# Patient Record
Sex: Male | Born: 1960 | Race: Black or African American | Hispanic: No | Marital: Single | Smoking: Current every day smoker
Health system: Southern US, Community
[De-identification: ages and names within clinical notes are randomized; demographics above are authoritative.]

## PROBLEM LIST (undated history)

## (undated) DIAGNOSIS — E119 Type 2 diabetes mellitus without complications: Secondary | ICD-10-CM

## (undated) DIAGNOSIS — I10 Essential (primary) hypertension: Secondary | ICD-10-CM

## (undated) DIAGNOSIS — J40 Bronchitis, not specified as acute or chronic: Secondary | ICD-10-CM

## (undated) DIAGNOSIS — K219 Gastro-esophageal reflux disease without esophagitis: Secondary | ICD-10-CM

## (undated) DIAGNOSIS — E782 Mixed hyperlipidemia: Secondary | ICD-10-CM

## (undated) DIAGNOSIS — I252 Old myocardial infarction: Secondary | ICD-10-CM

## (undated) HISTORY — PX: HERNIA REPAIR: SHX51

## (undated) HISTORY — DX: Type 2 diabetes mellitus without complications: E11.9

## (undated) HISTORY — DX: Essential (primary) hypertension: I10

## (undated) HISTORY — PX: OTHER SURGICAL HISTORY: SHX169

## (undated) HISTORY — DX: Old myocardial infarction: I25.2

## (undated) HISTORY — DX: Gastro-esophageal reflux disease without esophagitis: K21.9

## (undated) HISTORY — DX: Mixed hyperlipidemia: E78.2

---

## 2014-09-16 DIAGNOSIS — I252 Old myocardial infarction: Secondary | ICD-10-CM

## 2014-09-16 HISTORY — DX: Old myocardial infarction: I25.2

## 2017-08-29 ENCOUNTER — Observation Stay (HOSPITAL_COMMUNITY)
Admission: EM | Admit: 2017-08-29 | Discharge: 2017-08-30 | Disposition: A | Discharge status: Home / Self Care | Payer: Medicaid Other | Attending: Family Medicine | Admitting: Family Medicine

## 2017-08-29 ENCOUNTER — Emergency Department (HOSPITAL_COMMUNITY): Payer: Medicaid Other

## 2017-08-29 ENCOUNTER — Encounter (HOSPITAL_COMMUNITY): Payer: Self-pay | Admitting: Emergency Medicine

## 2017-08-29 ENCOUNTER — Other Ambulatory Visit: Payer: Self-pay

## 2017-08-29 DIAGNOSIS — N179 Acute kidney failure, unspecified: Secondary | ICD-10-CM | POA: Insufficient documentation

## 2017-08-29 DIAGNOSIS — J189 Pneumonia, unspecified organism: Secondary | ICD-10-CM | POA: Diagnosis present

## 2017-08-29 DIAGNOSIS — I10 Essential (primary) hypertension: Secondary | ICD-10-CM

## 2017-08-29 DIAGNOSIS — R0602 Shortness of breath: Secondary | ICD-10-CM | POA: Insufficient documentation

## 2017-08-29 DIAGNOSIS — Z23 Encounter for immunization: Secondary | ICD-10-CM | POA: Insufficient documentation

## 2017-08-29 DIAGNOSIS — E1165 Type 2 diabetes mellitus with hyperglycemia: Secondary | ICD-10-CM | POA: Diagnosis present

## 2017-08-29 DIAGNOSIS — IMO0002 Reserved for concepts with insufficient information to code with codable children: Secondary | ICD-10-CM | POA: Diagnosis present

## 2017-08-29 DIAGNOSIS — J181 Lobar pneumonia, unspecified organism: Secondary | ICD-10-CM | POA: Diagnosis not present

## 2017-08-29 DIAGNOSIS — R739 Hyperglycemia, unspecified: Secondary | ICD-10-CM

## 2017-08-29 DIAGNOSIS — E785 Hyperlipidemia, unspecified: Secondary | ICD-10-CM | POA: Diagnosis not present

## 2017-08-29 DIAGNOSIS — Z720 Tobacco use: Secondary | ICD-10-CM

## 2017-08-29 DIAGNOSIS — R0789 Other chest pain: Secondary | ICD-10-CM

## 2017-08-29 DIAGNOSIS — F17210 Nicotine dependence, cigarettes, uncomplicated: Secondary | ICD-10-CM | POA: Diagnosis not present

## 2017-08-29 DIAGNOSIS — R079 Chest pain, unspecified: Secondary | ICD-10-CM | POA: Diagnosis present

## 2017-08-29 HISTORY — DX: Bronchitis, not specified as acute or chronic: J40

## 2017-08-29 LAB — CBC
HCT: 45.2 % (ref 39.0–52.0)
HCT: 42.7 % (ref 39.0–52.0)
HEMOGLOBIN: 13.9 g/dL (ref 13.0–17.0)
Hemoglobin: 14.9 g/dL (ref 13.0–17.0)
MCH: 27.7 pg (ref 26.0–34.0)
MCH: 27.3 pg (ref 26.0–34.0)
MCHC: 33 g/dL (ref 30.0–36.0)
MCHC: 32.6 g/dL (ref 30.0–36.0)
MCV: 84 fL (ref 78.0–100.0)
MCV: 83.9 fL (ref 78.0–100.0)
PLATELETS: 271 10*3/uL (ref 150–400)
Platelets: 285 K/uL (ref 150–400)
RBC: 5.38 MIL/uL (ref 4.22–5.81)
RBC: 5.09 MIL/uL (ref 4.22–5.81)
RDW: 15.9 % — ABNORMAL HIGH (ref 11.5–15.5)
RDW: 15.9 % — ABNORMAL HIGH (ref 11.5–15.5)
WBC: 10 K/uL (ref 4.0–10.5)
WBC: 9.1 10*3/uL (ref 4.0–10.5)

## 2017-08-29 LAB — HEPATIC FUNCTION PANEL
ALBUMIN: 4.6 g/dL (ref 3.5–5.0)
ALT: 19 U/L (ref 17–63)
AST: 18 U/L (ref 15–41)
Alkaline Phosphatase: 131 U/L — ABNORMAL HIGH (ref 38–126)
BILIRUBIN DIRECT: 0.1 mg/dL (ref 0.1–0.5)
BILIRUBIN TOTAL: 0.8 mg/dL (ref 0.3–1.2)
Indirect Bilirubin: 0.7 mg/dL (ref 0.3–0.9)
Total Protein: 8.5 g/dL — ABNORMAL HIGH (ref 6.5–8.1)

## 2017-08-29 LAB — CREATININE, SERUM
CREATININE: 1.19 mg/dL (ref 0.61–1.24)
GFR calc non Af Amer: >60 mL/min (ref >60)

## 2017-08-29 LAB — GLUCOSE, CAPILLARY: Glucose-Capillary: 465 mg/dL — ABNORMAL HIGH (ref 65–99)

## 2017-08-29 LAB — C-REACTIVE PROTEIN: CRP: 5.9 mg/dL — ABNORMAL HIGH (ref <1.0)

## 2017-08-29 LAB — PROTIME-INR
INR: 0.88
Prothrombin Time: 11.9 s (ref 11.4–15.2)

## 2017-08-29 LAB — SEDIMENTATION RATE: Sed Rate: 27 mm/hr — ABNORMAL HIGH (ref 0–16)

## 2017-08-29 LAB — BASIC METABOLIC PANEL
ANION GAP: 12 (ref 5–15)
BUN: 14 mg/dL (ref 6–20)
CO2: 25 mmol/L (ref 22–32)
Calcium: 9.8 mg/dL (ref 8.9–10.3)
Chloride: 95 mmol/L — ABNORMAL LOW (ref 101–111)
Creatinine, Ser: 1.25 mg/dL — ABNORMAL HIGH (ref 0.61–1.24)
GFR calc Af Amer: >60 mL/min (ref >60)
GLUCOSE: 494 mg/dL — AB (ref 65–99)
POTASSIUM: 4.2 mmol/L (ref 3.5–5.1)
SODIUM: 132 mmol/L — AB (ref 135–145)

## 2017-08-29 LAB — TROPONIN I: Troponin I: <0.03 ng/mL

## 2017-08-29 LAB — BRAIN NATRIURETIC PEPTIDE: B Natriuretic Peptide: 9 pg/mL (ref 0.0–100.0)

## 2017-08-29 MED ORDER — INFLUENZA VAC SPLIT QUAD 0.5 ML IM SUSY
0.5000 mL | PREFILLED_SYRINGE | INTRAMUSCULAR | Status: AC
  Administered 2017-08-30: 0.5 mL via INTRAMUSCULAR

## 2017-08-29 MED ORDER — LEVOFLOXACIN IN D5W 500 MG/100ML IV SOLN
500.0000 mg | Freq: Once | INTRAVENOUS | Status: AC
  Administered 2017-08-29: 500 mg via INTRAVENOUS
  Filled 2017-08-29: qty 100

## 2017-08-29 MED ORDER — IPRATROPIUM-ALBUTEROL 0.5-2.5 (3) MG/3ML IN SOLN
3.0000 mL | RESPIRATORY_TRACT | Status: DC | PRN
  Administered 2017-08-30: 3 mL via RESPIRATORY_TRACT
  Filled 2017-08-29: qty 3

## 2017-08-29 MED ORDER — CLOPIDOGREL BISULFATE 75 MG PO TABS
75.0000 mg | ORAL_TABLET | Freq: Every day | ORAL | Status: DC
  Administered 2017-08-30: 75 mg via ORAL
  Filled 2017-08-29: qty 1

## 2017-08-29 MED ORDER — PREDNISONE 10 MG PO TABS
15.0000 mg | ORAL_TABLET | Freq: Two times a day (BID) | ORAL | Status: DC
  Administered 2017-08-29 – 2017-08-30 (×2): 15 mg via ORAL
  Filled 2017-08-29 (×2): qty 2

## 2017-08-29 MED ORDER — CARVEDILOL 3.125 MG PO TABS
3.1250 mg | ORAL_TABLET | Freq: Two times a day (BID) | ORAL | Status: DC
  Administered 2017-08-29 – 2017-08-30 (×2): 3.125 mg via ORAL
  Filled 2017-08-29 (×2): qty 1

## 2017-08-29 MED ORDER — SODIUM CHLORIDE 0.9 % IV SOLN
Freq: Once | INTRAVENOUS | Status: AC
  Administered 2017-08-29: 18:00:00 via INTRAVENOUS

## 2017-08-29 MED ORDER — SODIUM CHLORIDE 0.9 % IV BOLUS (SEPSIS)
1000.0000 mL | Freq: Once | INTRAVENOUS | Status: AC
  Administered 2017-08-29: 1000 mL via INTRAVENOUS

## 2017-08-29 MED ORDER — ENOXAPARIN SODIUM 60 MG/0.6ML ~~LOC~~ SOLN
60.0000 mg | SUBCUTANEOUS | Status: DC
  Administered 2017-08-29: 60 mg via SUBCUTANEOUS
  Filled 2017-08-29: qty 0.6

## 2017-08-29 MED ORDER — LACTATED RINGERS IV SOLN: INTRAVENOUS | Status: DC

## 2017-08-29 MED ORDER — INSULIN ASPART 100 UNIT/ML ~~LOC~~ SOLN
0.0000 [IU] | Freq: Every day | SUBCUTANEOUS | Status: DC
  Administered 2017-08-29: 7 [IU] via SUBCUTANEOUS

## 2017-08-29 MED ORDER — HYDROCODONE-ACETAMINOPHEN 5-325 MG PO TABS
1.0000 | ORAL_TABLET | Freq: Once | ORAL | Status: AC
  Administered 2017-08-29: 1 via ORAL
  Filled 2017-08-29: qty 1

## 2017-08-29 MED ORDER — CEFTRIAXONE SODIUM 1 G IJ SOLR
1.0000 g | INTRAMUSCULAR | Status: DC
  Filled 2017-08-29 (×2): qty 10

## 2017-08-29 MED ORDER — AZITHROMYCIN 250 MG PO TABS: 500.0000 mg | ORAL_TABLET | ORAL | Status: DC

## 2017-08-29 MED ORDER — INSULIN DETEMIR 100 UNIT/ML ~~LOC~~ SOLN
10.0000 [IU] | Freq: Every day | SUBCUTANEOUS | Status: DC
  Administered 2017-08-29: 10 [IU] via SUBCUTANEOUS
  Filled 2017-08-29 (×3): qty 0.1

## 2017-08-29 MED ORDER — HYDRALAZINE HCL 20 MG/ML IJ SOLN: 10.0000 mg | INTRAMUSCULAR | Status: DC | PRN

## 2017-08-29 MED ORDER — ASPIRIN EC 81 MG PO TBEC
81.0000 mg | DELAYED_RELEASE_TABLET | Freq: Every day | ORAL | Status: DC
  Administered 2017-08-30: 81 mg via ORAL
  Filled 2017-08-29: qty 1

## 2017-08-29 MED ORDER — NICOTINE 7 MG/24HR TD PT24
7.0000 mg | MEDICATED_PATCH | Freq: Every day | TRANSDERMAL | Status: DC
  Administered 2017-08-29 – 2017-08-30 (×2): 7 mg via TRANSDERMAL
  Filled 2017-08-29 (×2): qty 1

## 2017-08-29 MED ORDER — INSULIN ASPART 100 UNIT/ML ~~LOC~~ SOLN
0.0000 [IU] | Freq: Three times a day (TID) | SUBCUTANEOUS | Status: DC
  Administered 2017-08-30: 20 [IU] via SUBCUTANEOUS

## 2017-08-29 MED ORDER — ATORVASTATIN CALCIUM 40 MG PO TABS
80.0000 mg | ORAL_TABLET | Freq: Every day | ORAL | Status: DC
  Administered 2017-08-29: 80 mg via ORAL
  Filled 2017-08-29 (×3): qty 1
  Filled 2017-08-29: qty 2
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
## 2017-08-29 NOTE — ED Notes (Signed)
This RN has contacted Lab regarding lab add ons

## 2017-08-29 NOTE — ED Triage Notes (Signed)
PT c/o chest pain with deep breathing with productive yellow sputum cough x2 days. PT states he has a cardiac hx with 4 stents as well.

## 2017-08-29 NOTE — ED Notes (Signed)
[facility] called Rocephin and LR are not compatible. Spoke with dr Manuella Ghazi and gave order to change LR to NS
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
## 2017-08-29 NOTE — ED Notes (Signed)
Pt requesting pain medication for pain that goes across chest and radiates to right side as well as HA

## 2017-08-29 NOTE — Plan of Care (Signed)
Pt alert and oriented x4. Able to make needs known. No complaint of pain or distress at this time. Telemetry in place box 36. 20G to right FA with saline running at 40ml/hr. Bed in lowest position, call light within reach. Will continue to monitor.

## 2017-08-30 ENCOUNTER — Encounter (HOSPITAL_COMMUNITY): Payer: Self-pay | Admitting: Family Medicine

## 2017-08-30 ENCOUNTER — Observation Stay (HOSPITAL_BASED_OUTPATIENT_CLINIC_OR_DEPARTMENT_OTHER): Payer: Medicaid Other

## 2017-08-30 DIAGNOSIS — Z720 Tobacco use: Secondary | ICD-10-CM

## 2017-08-30 DIAGNOSIS — E785 Hyperlipidemia, unspecified: Secondary | ICD-10-CM

## 2017-08-30 DIAGNOSIS — R0789 Other chest pain: Secondary | ICD-10-CM

## 2017-08-30 DIAGNOSIS — E1165 Type 2 diabetes mellitus with hyperglycemia: Secondary | ICD-10-CM

## 2017-08-30 DIAGNOSIS — J181 Lobar pneumonia, unspecified organism: Secondary | ICD-10-CM | POA: Diagnosis not present

## 2017-08-30 DIAGNOSIS — I10 Essential (primary) hypertension: Secondary | ICD-10-CM | POA: Diagnosis not present

## 2017-08-30 DIAGNOSIS — F17210 Nicotine dependence, cigarettes, uncomplicated: Secondary | ICD-10-CM | POA: Diagnosis not present

## 2017-08-30 DIAGNOSIS — IMO0002 Reserved for concepts with insufficient information to code with codable children: Secondary | ICD-10-CM | POA: Diagnosis present

## 2017-08-30 DIAGNOSIS — R739 Hyperglycemia, unspecified: Secondary | ICD-10-CM

## 2017-08-30 DIAGNOSIS — R079 Chest pain, unspecified: Secondary | ICD-10-CM

## 2017-08-30 LAB — HEMOGLOBIN A1C
Hgb A1c MFr Bld: 11.5 % — ABNORMAL HIGH (ref 4.8–5.6)
MEAN PLASMA GLUCOSE: 283 mg/dL

## 2017-08-30 LAB — TROPONIN I

## 2017-08-30 LAB — ECHOCARDIOGRAM COMPLETE
Height: 72 in
WEIGHTICAEL: 4528 [oz_av]

## 2017-08-30 LAB — GLUCOSE, CAPILLARY
GLUCOSE-CAPILLARY: 406 mg/dL — AB (ref 65–99)
GLUCOSE-CAPILLARY: 431 mg/dL — AB (ref 65–99)

## 2017-08-30 LAB — HIV ANTIBODY (ROUTINE TESTING W REFLEX): HIV SCREEN 4TH GENERATION: NONREACTIVE

## 2017-08-30 MED ORDER — BLOOD GLUCOSE METER KIT: PACK | 0 refills | Status: DC

## 2017-08-30 MED ORDER — LIVING WELL WITH DIABETES BOOK
Freq: Once | Status: AC
  Administered 2017-08-30: 09:00:00
  Filled 2017-08-30: qty 1

## 2017-08-30 MED ORDER — LEVOFLOXACIN 750 MG PO TABS: 750.0000 mg | ORAL_TABLET | Freq: Every day | ORAL | 0 refills | Status: AC

## 2017-08-30 MED ORDER — METFORMIN HCL ER 500 MG PO TB24
500.0000 mg | ORAL_TABLET | Freq: Two times a day (BID) | ORAL | Status: DC
  Administered 2017-08-30: 500 mg via ORAL

## 2017-08-30 MED ORDER — INSULIN ASPART 100 UNIT/ML ~~LOC~~ SOLN
35.0000 [IU] | Freq: Once | SUBCUTANEOUS | Status: AC
  Administered 2017-08-30: 35 [IU] via SUBCUTANEOUS

## 2017-08-30 MED ORDER — LEVOFLOXACIN 750 MG PO TABS: 750.0000 mg | ORAL_TABLET | Freq: Every day | ORAL | 0 refills | Status: DC

## 2017-08-30 MED ORDER — INSULIN GLARGINE 100 UNIT/ML SOLOSTAR PEN: 15.0000 [IU] | PEN_INJECTOR | Freq: Every day | SUBCUTANEOUS | 0 refills | Status: DC

## 2017-08-30 MED ORDER — INSULIN ASPART 100 UNIT/ML ~~LOC~~ SOLN
8.0000 [IU] | Freq: Three times a day (TID) | SUBCUTANEOUS | Status: DC
  Administered 2017-08-30 (×2): 8 [IU] via SUBCUTANEOUS

## 2017-08-30 MED ORDER — INSULIN PEN NEEDLE 31G X 5 MM MISC: 1.0000 | 0 refills | Status: DC

## 2017-08-30 MED ORDER — METFORMIN HCL ER 500 MG PO TB24: ORAL_TABLET | ORAL | 0 refills | Status: DC

## 2017-08-30 MED ORDER — INSULIN DETEMIR 100 UNIT/ML ~~LOC~~ SOLN
20.0000 [IU] | Freq: Every day | SUBCUTANEOUS | Status: DC
  Filled 2017-08-30 (×2): qty 0.2

## 2017-08-30 NOTE — Progress Notes (Signed)
EKG completed and placed on chart 

## 2017-08-30 NOTE — Discharge Summary (Signed)
Physician Discharge Summary  Gregory Smith JIR:[id number] DOB: 1961-04-16 DOA: 08/29/2017  Admit date: 08/29/2017 Discharge date: 08/30/2017  Admitted From: Home  Disposition: Home   Recommendations for Outpatient Follow-up:  1. Follow up with PCP in 1 weeks 2. Follow up with endocrinologist in 2 weeks 3. Follow up with outpatient diabetes education center in 2-4 weeks 4. Please obtain BMP/CBC in 1-2 weeks on outpatient follow up.  5. Please monitor blood glucose testing numbers and adjust diabetes therapy as needed for better glycemic control 6. Please follow up on the following pending results: final culture data and 2 D echocardiogram   Discharge Condition: STABLE   CODE STATUS: FULL    Brief Hospitalization Summary: Please see all hospital notes, images, labs for full details of the hospitalization.  HPI: Gregory Smith is a 56 y.o. male with medical history significant for CAD with prior MI and associated cardiac arrests x2 with placement of 4 cardiac stents in 2017, hypertension, dyslipidemia, tobacco abuse, and obesity who presented to the emergency department with complaints of persistent chest pain as well as cough with sputum production over the last 4 days.  He states that he has been coughing up yellowish phlegm over the last few days and denies any fevers or chills.  He states that he has some sharp, achy pain around the lower border of his rib cage that apparently worsens when he lays flat, coughs, or takes in a deep breath.  He states that the pain is nothing like his prior MI, but back then he did have persistent indigestion and he claims that he has had some more indigestion recently.  The pain appears to be nonexertional in general, but he also states that shoveling snow yesterday caused him to feel worse.  He denies any dyspnea, nausea, vomiting, diaphoresis, or worsening edema.  He states that he has been out of some of his medications for the last 2 weeks and has been taking  some of the others, but does not know which.  He does not have a primary care physician in town that he has established with.  He recently moved here because his daughter is about to have twins.   ED Course: In the emergency department, his vital signs are stable with the exception of some elevated blood pressure.  His EKG is noted to have some sinus tachycardia with some ST and T wave abnormalities in the inferior leads.  Troponin is negative and chest x-ray demonstrates findings of lingular and right middle lobe infiltrates consistent with pneumonia.  He has no leukocytosis on his lab work and is noted to have a creatinine of 1.25 as well as a blood glucose of 494.  He has been started on IV Levaquin and given 1 L of IV fluid.  Right middle lobe community-acquired pneumonia Levofloxacin ordered.   Follow Sputum cultures Urine strep pneumo and Legionella ordered Duo nebs as needed for shortness of breath or wheezing  Atypical chest pain -Suggestive of pleurisy secondary to pneumonia 2D echo was ordered at admission, follow results on outpatient follow up.  Troponin q6 hours negative for any abnormalities EKG no acute findings Continue home medications  AKI Improved with IVF with NS  New Diagnosis of Type 2 DM with hyperglycemia HgA1c >11% Pt started on glucophage XR 500 mg with instructions to titrate dose to 2 tabs po BID with meals over a couple of weeks. Pt started on lantus solostar Pen 15 units QHS.  Titrate dose outpatient with PCP and endocrinologist.  I consulted the care manager to assist patient with primary care follow up, endocrinology appt and medications and supplies.  Pt given prescription for glucose monitor and testing strips, lancets and supplies.  Pt says that he has insurance coverage.  Hypoglycemia precautions reviewed with patient.  Pt was started on steroids on admission which have been discontinued.  Pt was instructed to test blood glucose 4-5 times per day and  bring readings to primary care physician and endocrinology appointments Pt was referred to outpatient diabetes education and nutrition center in [place].  Carb modified diet recommended. Pt was STRONGLY ADVISED to stop smoking cigarettes and using all tobacco products.  Pt verbalized understanding.   Hypertension Resume home medications Follow up outpatient for further management.   Diabetic Dyslipidemia - I anticipate lipids will drastically improve with better glycemic control. Continue atorvastatin 80 mg daily.    Tobacco abuse Counseled strongly to avoid tobacco products and explained the risks and dangers.  Pt verbalized understanding.    DVT prophylaxis: Lovenox Code Status: Full Family Communication: None Disposition Plan:Home Consults called:None Admission status: Obs, med-surg/tele  Discharge Diagnoses:  Active Problems:   Uncontrolled type 2 diabetes mellitus (HCC)   Chest pain, atypical   Community acquired pneumonia   Essential hypertension   Dyslipidemia   Tobacco abuse   Hyperglycemia   Pneumonia  Discharge Instructions: Discharge Instructions    Call MD for:  extreme fatigue   Complete by:  As directed    Call MD for:  hives   Complete by:  As directed    Call MD for:  persistant dizziness or light-headedness   Complete by:  As directed    Call MD for:  persistant nausea and vomiting   Complete by:  As directed    Call MD for:  severe uncontrolled pain   Complete by:  As directed    Increase activity slowly   Complete by:  As directed    Referral to Nutrition and Diabetes Services   Complete by:  As directed    Choose type of Diabetes Self-Management Training (DSMT) training services and number of hours requested:  Initial DSMT: 10 hours   Check all special needs that apply to patient requiring 1 on 1 DSMT:   Low literacy Financial barriers     DSMT Content:   Self-blood glucose monitoring Basic nutrition management Diabetes disease  process     Choose the type of Medical Nutrition Therapy (MNT) and number of hours:  Initial MNT: 3 hours   FOR MEDICARE PATIENTS: I hereby certify that I am managing this beneficiary's diabetes condition and that the above prescribed training is a necessary part of management.:  Does not apply     Allergies as of 08/30/2017   No Known Allergies     Medication List    TAKE these medications   albuterol 108 (90 Base) MCG/ACT inhaler Commonly known as:  PROVENTIL HFA;VENTOLIN HFA Inhale 1-2 puffs into the lungs every 6 (six) hours as needed for wheezing or shortness of breath.   aspirin EC 81 MG tablet Take 81 mg by mouth daily. For heart   atorvastatin 80 MG tablet Commonly known as:  LIPITOR Take 80 mg by mouth daily. For cholesterol   blood glucose meter kit and supplies Dispense based on patient and insurance preference. Use up to four times daily as directed. (FOR ICD-10 E10.9, E11.9).   carvedilol 3.125 MG tablet Commonly known as:  COREG Take 3.125 mg by mouth 2 (two) times  daily with a meal. For heart-blood pressure   clopidogrel 75 MG tablet Commonly known as:  PLAVIX Take 75 mg by mouth daily. For heart-blood thinner   Insulin Glargine 100 UNIT/ML Solostar Pen Commonly known as:  LANTUS Inject 15 Units into the skin daily at 10 pm.   Insulin Pen Needle 31G X 5 MM Misc 1 Device by Does not apply route as directed.   levofloxacin 750 MG tablet Commonly known as:  LEVAQUIN Take 1 tablet (750 mg total) by mouth daily for 6 days.   lisinopril 10 MG tablet Commonly known as:  PRINIVIL,ZESTRIL Take 10 mg by mouth daily. For blood pressure   metFORMIN 500 MG 24 hr tablet Commonly known as:  GLUCOPHAGE-XR Take 1 po BID with meals for 10 days, then 2 po BID with meals      Follow-up Information    Cassandria Anger, MD. Schedule an appointment as soon as possible for a visit in 2 week(s).   Specialty:  Endocrinology Why:  establish care diabetes  Contact  information: [place] [address] [phone]        Jani Gravel, MD. Schedule an appointment as soon as possible for a visit in 1 week(s).   Specialty:  Internal Medicine Why:  Establish care  Contact information: [address] [place] [address] [phone]          No Known Allergies Allergies as of 08/30/2017   No Known Allergies     Medication List    TAKE these medications   albuterol 108 (90 Base) MCG/ACT inhaler Commonly known as:  PROVENTIL HFA;VENTOLIN HFA Inhale 1-2 puffs into the lungs every 6 (six) hours as needed for wheezing or shortness of breath.   aspirin EC 81 MG tablet Take 81 mg by mouth daily. For heart   atorvastatin 80 MG tablet Commonly known as:  LIPITOR Take 80 mg by mouth daily. For cholesterol   blood glucose meter kit and supplies Dispense based on patient and insurance preference. Use up to four times daily as directed. (FOR ICD-10 E10.9, E11.9).   carvedilol 3.125 MG tablet Commonly known as:  COREG Take 3.125 mg by mouth 2 (two) times daily with a meal. For heart-blood pressure   clopidogrel 75 MG tablet Commonly known as:  PLAVIX Take 75 mg by mouth daily. For heart-blood thinner   Insulin Glargine 100 UNIT/ML Solostar Pen Commonly known as:  LANTUS Inject 15 Units into the skin daily at 10 pm.   Insulin Pen Needle 31G X 5 MM Misc 1 Device by Does not apply route as directed.   levofloxacin 750 MG tablet Commonly known as:  LEVAQUIN Take 1 tablet (750 mg total) by mouth daily for 6 days.   lisinopril 10 MG tablet Commonly known as:  PRINIVIL,ZESTRIL Take 10 mg by mouth daily. For blood pressure   metFORMIN 500 MG 24 hr tablet Commonly known as:  GLUCOPHAGE-XR Take 1 po BID with meals for 10 days, then 2 po BID with meals       Procedures/Studies: Dg Chest 2 View  Result Date: 08/29/2017 CLINICAL DATA:  Chest pain.  Cough. EXAM: CHEST  2 VIEW COMPARISON:  No recent  prior. FINDINGS: Mediastinum hilar structures normal. Lingular infiltrate Set lingular right middle lobe infiltrates noted consistent with pneumonia. No pleural effusion or pneumothorax. Degenerative changes thoracic spine . IMPRESSION: Lingular and right middle lobe infiltrates consistent with pneumonia . Follow-up exams to demonstrate clearing suggested. Electronically Signed  By: [place]   On: 08/29/2017 12:20     Subjective: Pt says that his chest pain is a lot better today.  He says that he is comfortable giving insulin injections as he has done so for his mother and father in the past.  He says he knows how to test his blood glucose and knows how to monitor for low blood sugar and how to treat low blood sugar.  He says he has insurance and can get medication and supplies filled.    Discharge Exam: Vitals:   08/29/17 2137 08/30/17 0616  BP: (!) 142/88 (!) 135/94  Pulse:    Resp: 18 16  Temp: 97.7 F (36.5 C) (!) 97 F (36.1 C)  SpO2: 98% 90%   Vitals:   08/29/17 1815 08/29/17 2059 08/29/17 2137 08/30/17 0616  BP:   (!) 142/88 (!) 135/94  Pulse:      Resp: '18  18 16  ' Temp:   97.7 F (36.5 C) (!) 97 F (36.1 C)  TempSrc:   Oral Oral  SpO2:  95% 98% 90%  Weight:      Height:       General: Pt is alert, awake, not in acute distress Cardiovascular: normal S1/S2 +, no rubs, no gallops Respiratory: diminish BS RUL, no wheezing, no rhonchi Abdominal: Soft, NT, ND, bowel sounds + Extremities: no edema, no cyanosis   The results of significant diagnostics from this hospitalization (including imaging, microbiology, ancillary and laboratory) are listed below for reference.     Microbiology: Recent Results (from the past 240 hour(s))  Culture, blood (routine x 2) Call MD if unable to obtain prior to antibiotics being given     Status: None (Preliminary result)   Collection Time: 08/29/17  6:17 PM  Result Value Ref Range Status   Specimen Description BLOOD  Final    Special Requests NONE  Final   Culture NO GROWTH < 12 HOURS  Final   Report Status PENDING  Incomplete  Culture, blood (routine x 2) Call MD if unable to obtain prior to antibiotics being given     Status: None (Preliminary result)   Collection Time: 08/29/17  6:17 PM  Result Value Ref Range Status   Specimen Description BLOOD  Final   Special Requests NONE  Final   Culture NO GROWTH < 12 HOURS  Final   Report Status PENDING  Incomplete     Labs: BNP (last 3 results) Recent Labs    08/29/17 1444  BNP 9.0   Basic Metabolic Panel: Recent Labs  Lab 08/29/17 1310 08/29/17 1817  NA 132*  --   K 4.2  --   CL 95*  --   CO2 25  --   GLUCOSE 494*  --   BUN 14  --   CREATININE 1.25* 1.19  CALCIUM 9.8  --    Liver Function Tests: Recent Labs  Lab 08/29/17 1444  AST 18  ALT 19  ALKPHOS 131*  BILITOT 0.8  PROT 8.5*  ALBUMIN 4.6   No results for input(s): LIPASE, AMYLASE in the last 168 hours. No results for input(s): AMMONIA in the last 168 hours. CBC: Recent Labs  Lab 08/29/17 1310 08/29/17 1817  WBC 10.0 9.1  HGB 14.9 13.9  HCT 45.2 42.7  MCV 84.0 83.9  PLT 285 271   Cardiac Enzymes: Recent Labs  Lab 08/29/17 1310 08/29/17 1817 08/29/17 2354 08/30/17 0644  TROPONINI <0.03 <0.03 <0.03 <0.03   BNP: Invalid input(s): POCBNP CBG:  Recent Labs  Lab 08/29/17 2143 08/30/17 0743  GLUCAP 465* 406*   D-Dimer No results for input(s): DDIMER in the last 72 hours. Hgb A1c Recent Labs    08/29/17 1817  HGBA1C 11.5*   Lipid Profile No results for input(s): CHOL, HDL, LDLCALC, TRIG, CHOLHDL, LDLDIRECT in the last 72 hours. Thyroid function studies No results for input(s): TSH, T4TOTAL, T3FREE, THYROIDAB in the last 72 hours.  Invalid input(s): FREET3 Anemia work up No results for input(s): VITAMINB12, FOLATE, FERRITIN, TIBC, IRON, RETICCTPCT in the last 72 hours. Urinalysis No results found for: COLORURINE, APPEARANCEUR, [place], [place], [place],  [place], [place], [place], PROTEINUR, UROBILINOGEN, NITRITE, LEUKOCYTESUR Sepsis Labs Invalid input(s): PROCALCITONIN,  WBC,  LACTICIDVEN Microbiology Recent Results (from the past 240 hour(s))  Culture, blood (routine x 2) Call MD if unable to obtain prior to antibiotics being given     Status: None (Preliminary result)   Collection Time: 08/29/17  6:17 PM  Result Value Ref Range Status   Specimen Description BLOOD  Final   Special Requests NONE  Final   Culture NO GROWTH < 12 HOURS  Final   Report Status PENDING  Incomplete  Culture, blood (routine x 2) Call MD if unable to obtain prior to antibiotics being given     Status: None (Preliminary result)   Collection Time: 08/29/17  6:17 PM  Result Value Ref Range Status   Specimen Description BLOOD  Final   Special Requests NONE  Final   Culture NO GROWTH < 12 HOURS  Final   Report Status PENDING  Incomplete   Time coordinating discharge:   SIGNED:  Irwin Brakeman, MD  [facility] 08/30/2017, 9:15 AM Pager [phone]  If 7PM-7AM, please contact night-coverage [email or web address] Password [email or web address]

## 2017-08-30 NOTE — Progress Notes (Signed)
Patient and family states understanding of discharge instructions, prescriptions given

## 2017-08-30 NOTE — Progress Notes (Signed)
  Echocardiogram 2D Echocardiogram has been performed.  Gregory Smith 08/30/2017, 8:53 AM

## 2017-08-30 NOTE — Care Management Note (Addendum)
Case Management Note  Patient Details  Name: Gregory Smith MRN: [id number] Date of Birth: Nov 12, 1960  Subjective/Objective:   Chest Pain               Action/Plan: CM talked to his wife Gregory Smith; she stated that they recently moved to [place] from [place]; he has Medicaid of [place] and is working on getting Medicaid of [place]; he has a card and she stated that he is able to get his medication; Patient can [facility] after discharge; All MD offices are closed on the weekend; for Specialist he can be referred from the [facility] for follow up care.  11:55 am CM talked to patient about wanting a [facility]; [place] called, talked to [facility] and he stated that they could not do a charity case and to privately pay for [facility] would cost $195; CM talked to the patient and he stated that he did not want a HHRN; Mindi Slicker RN  Expected Discharge Date:  08/30/17               Expected Discharge Plan:  Home/Self Care  Discharge planning Services  CM Consult    Status of Service:  Completed, signed off  Sherrilyn Rist [phone] 08/30/2017, 10:31 AM

## 2017-08-30 NOTE — Discharge Instructions (Signed)
Metformin ER Instructions:  Take 1 tab twice daily with meals for 10 days, then take 2 tabs twice daily with meals.    Follow with Primary MD and other consultants as instructed your Hospitalist MD  Please get a complete blood count and chemistry panel checked by your Primary MD at your next visit, and again as instructed by your Primary MD.  Please test your blood sugar 4 times per day and report them to your physician.   Get Medicines reviewed and adjusted: Please take all your medications with you for your next visit with your Primary MD  Laboratory/radiological data: Please request your Primary MD to go over all hospital tests and procedure/radiological results at the follow up, please ask your Primary MD to get all Hospital records sent to his/her office.  In some cases, they will be blood work, cultures and biopsy results pending at the time of your discharge. Please request that your primary care M.D. follows up on these results.  Also Note the following: If you experience worsening of your admission symptoms, develop shortness of breath, life threatening emergency, suicidal or homicidal thoughts you must seek medical attention immediately by calling 911 or calling your MD immediately  if symptoms less severe.  You must read complete instructions/literature along with all the possible adverse reactions/side effects for all the Medicines you take and that have been prescribed to you. Take any new Medicines after you have completely understood and accpet all the possible adverse reactions/side effects.   Do not drive when taking Pain medications or sleeping medications (Benzodaizepines)  Do not take more than prescribed Pain, Sleep and Anxiety Medications. It is not advisable to combine anxiety,sleep and pain medications without talking with your primary care practitioner  Special Instructions: If you have smoked or chewed Tobacco  in the last 2 yrs please stop smoking, stop any regular  Alcohol  and or any Recreational drug use.  Wear Seat belts while driving.  Please note: You were cared for by a hospitalist during your hospital stay. Once you are discharged, your primary care physician will handle any further medical issues. Please note that NO REFILLS for any discharge medications will be authorized once you are discharged, as it is imperative that you return to your primary care physician (or establish a relationship with a primary care physician if you do not have one) for your post hospital discharge needs so that they can reassess your need for medications and monitor your lab values.     Blood Glucose Monitoring, Adult Monitoring your blood sugar (glucose) helps you manage your diabetes. It also helps you and your health care provider determine how well your diabetes management plan is working. Blood glucose monitoring involves checking your blood glucose as often as directed, and keeping a record (log) of your results over time. Why should I monitor my blood glucose? Checking your blood glucose regularly can:  Help you understand how food, exercise, illnesses, and medicines affect your blood glucose.  Let you know what your blood glucose is at any time. You can quickly tell if you are having low blood glucose (hypoglycemia) or high blood glucose (hyperglycemia).  Help you and your health care provider adjust your medicines as needed.  When should I check my blood glucose? Follow instructions from your health care provider about how often to check your blood glucose. This may depend on:  The type of diabetes you have.  How well-controlled your diabetes is.  Medicines you are taking.  If  you have type 1 diabetes:  Check your blood glucose at least 2 times a day.  Also check your blood glucose: ? Before every insulin injection. ? Before and after exercise. ? Between meals. ? 2 hours after a meal. ? Occasionally between 2:00 a.m. and 3:00 a.m., as  directed. ? Before potentially dangerous tasks, like driving or using heavy machinery. ? At bedtime.  You may need to check your blood glucose more often, up to 6-10 times a day: ? If you use an insulin pump. ? If you need multiple daily injections (MDI). ? If your diabetes is not well-controlled. ? If you are ill. ? If you have a history of severe hypoglycemia. ? If you have a history of not knowing when your blood glucose is getting low (hypoglycemia unawareness). If you have type 2 diabetes:  If you take insulin or other diabetes medicines, check your blood glucose at least 2 times a day.  If you are on intensive insulin therapy, check your blood glucose at least 4 times a day. Occasionally, you may also need to check between 2:00 a.m. and 3:00 a.m., as directed.  Also check your blood glucose: ? Before and after exercise. ? Before potentially dangerous tasks, like driving or using heavy machinery.  You may need to check your blood glucose more often if: ? Your medicine is being adjusted. ? Your diabetes is not well-controlled. ? You are ill. What is a blood glucose log?  A blood glucose log is a record of your blood glucose readings. It helps you and your health care provider: ? Look for patterns in your blood glucose over time. ? Adjust your diabetes management plan as needed.  Every time you check your blood glucose, write down your result and notes about things that may be affecting your blood glucose, such as your diet and exercise for the day.  Most glucose meters store a record of glucose readings in the meter. Some meters allow you to download your records to a computer. How do I check my blood glucose? Follow these steps to get accurate readings of your blood glucose: Supplies needed   Blood glucose meter.  Test strips for your meter. Each meter has its own strips. You must use the strips that come with your meter.  A needle to prick your finger (lancet). Do not  use lancets more than once.  A device that holds the lancet (lancing device).  A journal or log book to write down your results. Procedure  Wash your hands with soap and water.  Prick the side of your finger (not the tip) with the lancet. Use a different finger each time.  Gently rub the finger until a small drop of blood appears.  Follow instructions that come with your meter for inserting the test strip, applying blood to the strip, and using your blood glucose meter.  Write down your result and any notes. Alternative testing sites  Some meters allow you to use areas of your body other than your finger (alternative sites) to test your blood.  If you think you may have hypoglycemia, or if you have hypoglycemia unawareness, do not use alternative sites. Use your finger instead.  Alternative sites may not be as accurate as the fingers, because blood flow is slower in these areas. This means that the result you get may be delayed, and it may be different from the result that you would get from your finger.  The most common alternative sites are: ?  Forearm. ? Thigh. ? Palm of the hand. Additional tips  Always keep your supplies with you.  If you have questions or need help, all blood glucose meters have a 24-hour hotline number that you can call. You may also contact your health care provider.  After you use a few boxes of test strips, adjust (calibrate) your blood glucose meter by following instructions that came with your meter. This information is not intended to replace advice given to you by your health care provider. Make sure you discuss any questions you have with your health care provider. Document Released: 09/05/2003 Document Revised: 03/22/2016 Document Reviewed: 02/12/2016 Elsevier Interactive Patient Education  2017 [employer].  Type 2 Diabetes Mellitus, Diagnosis, Adult Type 2 diabetes (type 2 diabetes mellitus) is a long-term (chronic) disease. In type 2  diabetes, one or both of these problems may be present:  The pancreas does not make enough of a hormone called insulin.  Cells in the body do not respond properly to insulin that the body makes (insulin resistance).  Normally, insulin allows blood sugar (glucose) to enter cells in the body. The cells use glucose for energy. Insulin resistance or lack of insulin causes excess glucose to build up in the blood instead of going into cells. As a result, high blood glucose (hyperglycemia) develops. What increases the risk? The following factors may make you more likely to develop type 2 diabetes:  Having a family member with type 2 diabetes.  Being overweight or obese.  Having an inactive (sedentary) lifestyle.  Having been diagnosed with insulin resistance.  Having a history of prediabetes, gestational diabetes, or polycystic ovarian syndrome (PCOS).  Being of American-Indian, African-American, Hispanic/Latino, or Asian/Pacific Islander descent.  What are the signs or symptoms? In the early stage of this condition, you may not have symptoms. Symptoms develop slowly and may include:  Increased thirst (polydipsia).  Increased hunger(polyphagia).  Increased urination (polyuria).  Increased urination during the night (nocturia).  Unexplained weight loss.  Frequent infections that keep coming back (recurring).  Fatigue.  Weakness.  Vision changes, such as blurry vision.  Cuts or bruises that are slow to heal.  Tingling or numbness in the hands or feet.  Dark patches on the skin (acanthosis nigricans).  How is this diagnosed?  This condition is diagnosed based on your symptoms, your medical history, a physical exam, and your blood glucose level. Your blood glucose may be checked with one or more of the following blood tests:  A fasting blood glucose (FBG) test. You will not be allowed to eat (you will fast) for at least 8 hours before a blood sample is taken.  A random  blood glucose test. This checks blood glucose at any time of day regardless of when you ate.  An A1c (hemoglobin A1c) blood test. This provides information about blood glucose control over the previous 2-3 months.  An oral glucose tolerance test (OGTT). This measures your blood glucose at two times: ? After fasting. This is your baseline blood glucose level. ? Two hours after drinking a beverage that contains glucose.  You may be diagnosed with type 2 diabetes if:  Your FBG level is 126 mg/dL (7.0 mmol/L) or higher.  Your random blood glucose level is 200 mg/dL (11.1 mmol/L) or higher.  Your A1c level is 6.5% or higher.  Your OGGT result is higher than 200 mg/dL (11.1 mmol/L).  These blood tests may be repeated to confirm your diagnosis. How is this treated?  Your treatment may be  managed by a specialist called an endocrinologist. Type 2 diabetes may be treated by following instructions from your health care provider about:  Making diet and lifestyle changes. This may include: ? Following an individualized nutrition plan that is developed by a diet and nutrition specialist (registered dietitian). ? Exercising regularly. ? Finding ways to manage stress.  Checking your blood glucose level as often as directed.  Taking diabetes medicines or insulin daily. This helps to keep your blood glucose levels in the healthy range. ? If you use insulin, you may need to adjust the dosage depending on how physically active you are and what foods you eat. Your health care provider will tell you how to adjust your dosage.  Taking medicines to help prevent complications from diabetes, such as: ? Aspirin. ? Medicine to lower cholesterol. ? Medicine to control blood pressure.  Your health care provider will set individualized treatment goals for you. Your goals will be based on your age, other medical conditions you have, and how you respond to diabetes treatment. Generally, the goal of treatment is  to maintain the following blood glucose levels:  Before meals (preprandial): 80-130 mg/dL (4.4-7.2 mmol/L).  After meals (postprandial): below 180 mg/dL (10 mmol/L).  A1c level: less than 7%.  Follow these instructions at home: Questions to [facility] Provider Consider asking the following questions:  Do I need to meet with a diabetes educator?  Where can I find a support group for people with diabetes?  What equipment will I need to manage my diabetes at home?  What diabetes medicines do I need, and when should I take them?  How often do I need to check my blood glucose?  What number can I call if I have questions?  When is my next appointment?  General instructions  Take over-the-counter and prescription medicines only as told by your health care provider.  Keep all follow-up visits as told by your health care provider. This is important.  For more information about diabetes, visit: ? American Diabetes Association (ADA): www.diabetes.org ? American Association of Diabetes Educators (AADE): www.diabeteseducator.org/patient-resources Contact a health care provider if:  Your blood glucose is at or above 240 mg/dL (13.3 mmol/L) for 2 days in a row.  You have been sick or have had a fever for 2 days or longer and you are not getting better.  You have any of the following problems for more than 6 hours: ? You cannot eat or drink. ? You have nausea and vomiting. ? You have diarrhea. Get help right away if:  Your blood glucose is lower than 54 mg/dL (3.0 mmol/L).  You become confused or you have trouble thinking clearly.  You have difficulty breathing.  You have moderate or large ketone levels in your urine. This information is not intended to replace advice given to you by your health care provider. Make sure you discuss any questions you have with your health care provider. Document Released: 09/02/2005 Document Revised: 02/08/2016 Document Reviewed:  10/06/2015 Elsevier Interactive Patient Education  2017 [employer] Diabetes Mellitus and Food It is important for you to manage your blood sugar (glucose) level. Your blood glucose level can be greatly affected by what you eat. Eating healthier foods in the appropriate amounts throughout the day at about the same time each day will help you control your blood glucose level. It can also help slow or prevent worsening of your diabetes mellitus. Healthy eating may even help you improve the level of your blood  pressure and reach or maintain a healthy weight. General recommendations for healthful eating and cooking habits include:  Eating meals and snacks regularly. Avoid going long periods of time without eating to lose weight.  Eating a diet that consists mainly of plant-based foods, such as fruits, vegetables, nuts, legumes, and whole grains.  Using low-heat cooking methods, such as baking, instead of high-heat cooking methods, such as deep frying.  Work with your dietitian to make sure you understand how to use the Nutrition Facts information on food labels. How can food affect me? Carbohydrates Carbohydrates affect your blood glucose level more than any other type of food. Your dietitian will help you determine how many carbohydrates to eat at each meal and teach you how to count carbohydrates. Counting carbohydrates is important to keep your blood glucose at a healthy level, especially if you are using insulin or taking certain medicines for diabetes mellitus. Alcohol Alcohol can cause sudden decreases in blood glucose (hypoglycemia), especially if you use insulin or take certain medicines for diabetes mellitus. Hypoglycemia can be a life-threatening condition. Symptoms of hypoglycemia (sleepiness, dizziness, and disorientation) are similar to symptoms of having too much alcohol. If your health care provider has given you approval to drink alcohol, do so in moderation and use the following  guidelines:  Women should not have more than one drink per day, and men should not have more than two drinks per day. One drink is equal to: ? 12 oz of beer. ? 5 oz of wine. ? 1 oz of hard liquor.  Do not drink on an empty stomach.  Keep yourself hydrated. Have water, diet soda, or unsweetened iced tea.  Regular soda, juice, and other mixers might contain a lot of carbohydrates and should be counted.  What foods are not recommended? As you make food choices, it is important to remember that all foods are not the same. Some foods have fewer nutrients per serving than other foods, even though they might have the same number of calories or carbohydrates. It is difficult to get your body what it needs when you eat foods with fewer nutrients. Examples of foods that you should avoid that are high in calories and carbohydrates but low in nutrients include:  Trans fats (most processed foods list trans fats on the Nutrition Facts label).  Regular soda.  Juice.  Candy.  Sweets, such as cake, pie, doughnuts, and cookies.  Fried foods.  What foods can I eat? Eat nutrient-rich foods, which will nourish your body and keep you healthy. The food you should eat also will depend on several factors, including:  The calories you need.  The medicines you take.  Your weight.  Your blood glucose level.  Your blood pressure level.  Your cholesterol level.  You should eat a variety of foods, including:  Protein. ? Lean cuts of meat. ? Proteins low in saturated fats, such as fish, egg whites, and beans. Avoid processed meats.  Fruits and vegetables. ? Fruits and vegetables that may help control blood glucose levels, such as apples, mangoes, and yams.  Dairy products. ? Choose fat-free or low-fat dairy products, such as milk, yogurt, and cheese.  Grains, bread, pasta, and rice. ? Choose whole grain products, such as multigrain bread, whole oats, and brown rice. These foods may help  control blood pressure.  Fats. ? Foods containing healthful fats, such as nuts, avocado, olive oil, canola oil, and fish.  Does everyone with diabetes mellitus have the same meal plan? Because every  person with diabetes mellitus is different, there is not one meal plan that works for everyone. It is very important that you meet with a dietitian who will help you create a meal plan that is just right for you. This information is not intended to replace advice given to you by your health care provider. Make sure you discuss any questions you have with your health care provider. Document Released: 05/30/2005 Document Revised: 02/08/2016 Document Reviewed: 07/30/2013 Elsevier Interactive Patient Education  2017 [place] Pneumonia, Adult Pneumonia is an infection of the lungs. There are different types of pneumonia. One type can develop while a person is in a hospital. A different type, called community-acquired pneumonia, develops in people who are not, or have not recently been, in the hospital or other health care facility. What are the causes? Pneumonia may be caused by bacteria, viruses, or funguses. Community-acquired pneumonia is often caused by Streptococcus pneumonia bacteria. These bacteria are often passed from one person to another by breathing in droplets from the cough or sneeze of an infected person. What increases the risk? The condition is more likely to develop in:  People who havechronic diseases, such as chronic obstructive pulmonary disease (COPD), asthma, congestive heart failure, cystic fibrosis, diabetes, or kidney disease.  People who haveearly-stage or late-stage HIV.  People who havesickle cell disease.  People who havehad their spleen removed (splenectomy).  People who havepoor Human resources officer.  People who havemedical conditions that increase the risk of breathing in (aspirating) secretions their own mouth and nose.  People who  havea weakened immune system (immunocompromised).  People who smoke.  People whotravel to areas where pneumonia-causing germs commonly exist.  People whoare around animal habitats or animals that have pneumonia-causing germs, including birds, bats, rabbits, cats, and farm animals.  What are the signs or symptoms? Symptoms of this condition include:  Adry cough.  A wet (productive) cough.  Fever.  Sweating.  Chest pain, especially when breathing deeply or coughing.  Rapid breathing or difficulty breathing.  Shortness of breath.  Shaking chills.  Fatigue.  Muscle aches.  How is this diagnosed? Your health care provider will take a medical history and perform a physical exam. You may also have other tests, including:  Imaging studies of your chest, including X-rays.  Tests to check your blood oxygen level and other blood gases.  Other tests on blood, mucus (sputum), fluid around your lungs (pleural fluid), and urine.  If your pneumonia is severe, other tests may be done to identify the specific cause of your illness. How is this treated? The type of treatment that you receive depends on many factors, such as the cause of your pneumonia, the medicines you take, and other medical conditions that you have. For most adults, treatment and recovery from pneumonia may occur at home. In some cases, treatment must happen in a hospital. Treatment may include:  Antibiotic medicines, if the pneumonia was caused by bacteria.  Antiviral medicines, if the pneumonia was caused by a virus.  Medicines that are given by mouth or through an IV tube.  Oxygen.  Respiratory therapy.  Although rare, treating severe pneumonia may include:  Mechanical ventilation. This is done if you are not breathing well on your own and you cannot maintain a safe blood oxygen level.  Thoracentesis. This procedureremoves fluid around one lung or both lungs to help you breathe better.  Follow  these instructions at home:  Take over-the-counter and prescription medicines only as told by your health  care provider. ? Only takecough medicine if you are losing sleep. Understand that cough medicine can prevent your bodys natural ability to remove mucus from your lungs. ? If you were prescribed an antibiotic medicine, take it as told by your health care provider. Do not stop taking the antibiotic even if you start to feel better.  Sleep in a semi-upright position at night. Try sleeping in a reclining chair, or place a few pillows under your head.  Do not use tobacco products, including cigarettes, chewing tobacco, and e-cigarettes. If you need help quitting, ask your health care provider.  Drink enough water to keep your urine clear or pale yellow. This will help to thin out mucus secretions in your lungs. How is this prevented? There are ways that you can decrease your risk of developing community-acquired pneumonia. Consider getting a pneumococcal vaccine if:  You are older than 54 years of age.  You are older than 54 years of age and are undergoing cancer treatment, have chronic lung disease, or have other medical conditions that affect your immune system. Ask your health care provider if this applies to you.  There are different types and schedules of pneumococcal vaccines. Ask your health care provider which vaccination option is best for you. You may also prevent community-acquired pneumonia if you take these actions:  Get an influenza vaccine every year. Ask your health care provider which type of influenza vaccine is best for you.  Go to the dentist on a regular basis.  Wash your hands often. Use hand sanitizer if soap and water are not available.  Contact a health care provider if:  You have a fever.  You are losing sleep because you cannot control your cough with cough medicine. Get help right away if:  You have worsening shortness of breath.  You have increased  chest pain.  Your sickness becomes worse, especially if you are an older adult or have a weakened immune system.  You cough up blood. This information is not intended to replace advice given to you by your health care provider. Make sure you discuss any questions you have with your health care provider. Document Released: 09/02/2005 Document Revised: 01/11/2016 Document Reviewed: 12/28/2014 Elsevier Interactive Patient Education  2017 [employer].

## 2017-09-03 ENCOUNTER — Ambulatory Visit: Payer: Self-pay | Admitting: "Endocrinology

## 2017-09-03 LAB — CULTURE, BLOOD (ROUTINE X 2)
Culture: NO GROWTH
Culture: NO GROWTH
SPECIAL REQUESTS: ADEQUATE

## 2017-09-10 ENCOUNTER — Ambulatory Visit (INDEPENDENT_AMBULATORY_CARE_PROVIDER_SITE_OTHER): Payer: Self-pay | Admitting: "Endocrinology

## 2017-09-10 ENCOUNTER — Encounter: Payer: Self-pay | Admitting: "Endocrinology

## 2017-09-10 VITALS — BP 140/80 | HR 85 | Wt 285.0 lb

## 2017-09-10 DIAGNOSIS — Z720 Tobacco use: Secondary | ICD-10-CM

## 2017-09-10 DIAGNOSIS — Z6838 Body mass index (BMI) 38.0-38.9, adult: Secondary | ICD-10-CM

## 2017-09-10 DIAGNOSIS — E782 Mixed hyperlipidemia: Secondary | ICD-10-CM

## 2017-09-10 DIAGNOSIS — E1165 Type 2 diabetes mellitus with hyperglycemia: Secondary | ICD-10-CM

## 2017-09-10 DIAGNOSIS — I10 Essential (primary) hypertension: Secondary | ICD-10-CM

## 2017-09-10 LAB — POCT CBG (FASTING - GLUCOSE)-MANUAL ENTRY: GLUCOSE FASTING, POC: 111 mg/dL — AB (ref 70–99)

## 2017-09-10 MED ORDER — METFORMIN HCL ER 500 MG PO TB24: ORAL_TABLET | ORAL | 2 refills | Status: DC

## 2017-09-10 MED ORDER — GLUCOSE BLOOD VI STRP: ORAL_STRIP | 3 refills | Status: DC

## 2017-09-10 NOTE — Patient Instructions (Signed)
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
## 2017-09-24 ENCOUNTER — Ambulatory Visit: Payer: Self-pay | Admitting: Nutrition

## 2017-10-08 ENCOUNTER — Other Ambulatory Visit: Payer: Self-pay | Admitting: "Endocrinology

## 2017-10-09 LAB — LIPID PANEL W/O CHOL/HDL RATIO
Cholesterol, Total: 162 mg/dL (ref 100–199)
HDL: 38 mg/dL — ABNORMAL LOW (ref >39)
LDL Calculated: 110 mg/dL — ABNORMAL HIGH (ref 0–99)
Triglycerides: 70 mg/dL (ref 0–149)
VLDL CHOLESTEROL CAL: 14 mg/dL (ref 5–40)

## 2017-10-09 LAB — MICROALBUMIN / CREATININE URINE RATIO
Creatinine, Urine: 228.5 mg/dL
MICROALB/CREAT RATIO: 14.9 mg/g{creat} (ref 0.0–30.0)
MICROALBUM., U, RANDOM: 34 ug/mL

## 2017-10-09 LAB — TSH: TSH: 2.42 u[IU]/mL (ref 0.450–4.500)

## 2017-10-09 LAB — HGB A1C W/O EAG: HEMOGLOBIN A1C: 9.3 % — AB (ref 4.8–5.6)

## 2017-10-09 LAB — SPECIMEN STATUS REPORT

## 2017-10-09 LAB — T4, FREE: FREE T4: 1 ng/dL (ref 0.82–1.77)

## 2017-10-16 ENCOUNTER — Encounter: Payer: Self-pay | Admitting: Gastroenterology

## 2017-10-20 ENCOUNTER — Ambulatory Visit: Payer: Self-pay | Admitting: Nutrition

## 2017-10-27 ENCOUNTER — Ambulatory Visit: Payer: Self-pay | Admitting: Nutrition

## 2017-11-06 ENCOUNTER — Ambulatory Visit: Payer: Self-pay

## 2017-11-20 ENCOUNTER — Ambulatory Visit: Payer: Self-pay

## 2017-11-20 ENCOUNTER — Telehealth: Payer: Self-pay | Admitting: *Deleted

## 2017-11-20 ENCOUNTER — Encounter: Payer: Self-pay | Admitting: *Deleted

## 2017-11-20 NOTE — Telephone Encounter (Signed)
PATIENT WAS A NO SHOW AND LETTER SENT  °

## 2017-11-20 NOTE — Telephone Encounter (Signed)
noted 

## 2017-11-24 ENCOUNTER — Ambulatory Visit: Payer: Self-pay | Admitting: Nutrition

## 2017-12-09 ENCOUNTER — Encounter: Payer: Self-pay | Admitting: "Endocrinology

## 2017-12-09 ENCOUNTER — Ambulatory Visit (INDEPENDENT_AMBULATORY_CARE_PROVIDER_SITE_OTHER): Payer: Medicaid Other | Admitting: "Endocrinology

## 2017-12-09 VITALS — BP 149/90 | HR 78 | Ht 72.0 in | Wt 291.0 lb

## 2017-12-09 DIAGNOSIS — E782 Mixed hyperlipidemia: Secondary | ICD-10-CM

## 2017-12-09 DIAGNOSIS — E66812 Obesity, class 2: Secondary | ICD-10-CM

## 2017-12-09 DIAGNOSIS — Z6838 Body mass index (BMI) 38.0-38.9, adult: Secondary | ICD-10-CM

## 2017-12-09 DIAGNOSIS — I10 Essential (primary) hypertension: Secondary | ICD-10-CM | POA: Diagnosis not present

## 2017-12-09 DIAGNOSIS — E1165 Type 2 diabetes mellitus with hyperglycemia: Secondary | ICD-10-CM | POA: Diagnosis not present

## 2017-12-09 NOTE — Patient Instructions (Signed)
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
## 2017-12-23 ENCOUNTER — Ambulatory Visit (INDEPENDENT_AMBULATORY_CARE_PROVIDER_SITE_OTHER): Payer: Self-pay

## 2017-12-23 DIAGNOSIS — Z1211 Encounter for screening for malignant neoplasm of colon: Secondary | ICD-10-CM

## 2017-12-23 MED ORDER — PEG-KCL-NACL-NASULF-NA ASC-C 100 G PO SOLR: 1.0000 | ORAL | 0 refills | Status: DC

## 2017-12-23 NOTE — Patient Instructions (Signed)
SPLIT MOVIPREP INSTRUCTION SHEET  Please notify us immediately if you are diabetic, take iron supplements, or if you are on coumadin or any blood thinners.  Patient Name:  Gregory Smith Date of procedure:  02/11/18 Time to register at [facility] Stay:  9:30am Provider:  Dr. Lynn Ito will need to purchase 1 fleet enema     02/10/18  1 Day prior to procedure:     CLEAR LIQUIDS ALL DAY--NO SOLID FOODS!    At 5:00 PM Begin the prep as follows:     Empty 1 pouch A and 1 pouch B into disposable container.  Add lukewarm drinking water to the top line of the container.  Mix to dissolve.  You can mix solution ahead of time & refrigerate prior to drinking.  The solution should be used within 24 hours.  The container is divided by 4 marks.  Every 15 minutes, drink the solution down to the next mark (approx 8 oz) until the liter is complete.  Be sure to drink 16 ounces of clear liquid of your choice (this is important to make sure you stay hydrated and the prep works)   Verdel  ([place]). You may take heart, blood pressure, or breathing  medications  02/11/18  Day of Procedure    Diabetic medications adjustments for today: see letter   Five hours before your procedure at 5:30am complete prep as follows:  Empty 1 pouch A and 1 pouch B into disposable container.  Add lukewarm drinking water to the top line of the container.  Mix to dissolve.  You can mix solution ahead of time & refrigerate prior to drinking.  The solution should be used within 24 hours.  The container is divided by 4 marks.  Every 15 minutes, drink the solution down to the next mark (approx 8 oz) until the liter is complete.  You must complete the entire prep to ensure the most effective cleaning.  Be sure to drink 16 ounces of clear liquid of your choice (this is important to make sure you stay hydrated and the prep works)  Should complete within 1 and 1/2  hours.   NOTHING TO EAT OR DRINK AFTER 7:30AM (3 hours before your procedure)  Give yourself one Fleet enema about 1 hour prior to leaving for the hospital.  You may take TYLENOL products.  Please continue your regular medications unless we have instructed you otherwise.     Please note, on the day of your procedure you MUST be accompanied by an adult who is willing to assume responsibility for you at time of discharge. If you do not have such person with you, your procedure will have to be rescheduled.                                                                                                                     Please leave ALL jewelry at home prior to coming to the hospital  for your procedure.   *It is your responsibility to check with your insurance company for the benefits of coverage you have for this procedure. Unfortunately, not all insurance companies have benefits to cover all or part of these types of procedures. It is your responsibility to check your benefits, however we will be glad to assist you with any codes your insurance company may need.   Please note that most insurance companies will not cover a screening colonoscopy for people under the age of 49  For example, with some insurance companies you may have benefits for a screening colonoscopy, but if polyps are found the diagnosis will change and then you may have a deductible that will need to be met. Please make sure you check your benefits for screening colonoscopy as well as a diagnostic colonoscopy.   CLEAR LIQUIDS: (NO RED) Jello Apple Juice  White Grape Juice Water Banana popsicles  Kool-Aid  Coffee(No cream or milk) Tea (No cream or milk) Soft drinks Broth (fat free beef/chicken/vegetable)  Clear liquids allow you to see your fingers on the other side of the glass.  Be sure they are NOT RED in color, cloudy, but CLEAR.  Do Not Eat: Dairy products of any kind Cranberry juice Tomato or V8 Juice  Orange  Juice Grapefruit Juice  Red Grape Juice Solid foods like cereal, oatmeal, yogurt, fruits, vegetables, creamed soups, eggs, bread, etc   HELPFUL HINTS TO MAKE DRINKING EASIER: -Make sure prep is extremely COLD.  Refrigerate the night before.  You may also put in freezer. -You may try mixing Crystal Light or Country Time Lemonade if you prefer.  MIx in small amounts.  Add more if necessary. -Trying drinking through a straw. -Rinse mouth with water or mouthwash between glasses to remove aftertaste. -Try sipping on a cold beverage/ice popsicles between glasses of prep. -Place a piece of sugar-free hard candy in mouth between glasses. -If you become nauseated, try consuming smaller amounts or stretch out the time between glasses.  Stop for 30 minutes to an hour & slowly start back drinking.  Call our office with any questions or concerns at [phone].  Thank You

## 2017-12-23 NOTE — Progress Notes (Signed)
Gastroenterology Pre-Procedure Review  Request Date:12/23/17 Requesting Physician: Dr.Kim ( no previous tcs)  PATIENT REVIEW QUESTIONS: The patient responded to the following health history questions as indicated:    1. Diabetes Melitis: yes (metformin 2 bid, he is no longer on insulin) 2. Joint replacements in the past 12 months: no 3. Major health problems in the past 3 months: no 4. Has an artificial valve or MVP: no 5. Has a defibrillator: no 6. Has been advised in past to take antibiotics in advance of a procedure like teeth cleaning: no 7. Family history of colon cancer: no  8. Alcohol Use: no 9. History of sleep apnea: yes (pt feels like he has sleep apnea, he has not been tested, no cpap)  10. History of coronary artery or other vascular stents placed within the last 12 months: no (pt had MI in 2017 when he lived in [place]. He stated he had 3 stents put in at that time) 11. History of any prior anesthesia complications: no    MEDICATIONS & ALLERGIES:    Patient reports the following regarding taking any blood thinners:   Plavix? yes (47m) Aspirin? yes (844m Coumadin? no Brilinta? no Xarelto? no Eliquis? no Pradaxa? no Savaysa? no Effient? no  Patient confirms/reports the following medications:  Current Outpatient Medications  Medication Sig Dispense Refill  . albuterol (PROVENTIL HFA;VENTOLIN HFA) 108 (90 Base) MCG/ACT inhaler Inhale 1-2 puffs into the lungs every 6 (six) hours as needed for wheezing or shortness of breath.    . Marland Kitchenspirin EC 81 MG tablet Take 81 mg by mouth daily. For heart    . atorvastatin (LIPITOR) 80 MG tablet Take 80 mg by mouth daily. For cholesterol    . carvedilol (COREG) 3.125 MG tablet Take 3.125 mg by mouth 2 (two) times daily with a meal. For heart-blood pressure    . clopidogrel (PLAVIX) 75 MG tablet Take 75 mg by mouth daily. For heart-blood thinner    . lisinopril (PRINIVIL,ZESTRIL) 10 MG tablet Take 10 mg by mouth daily. For blood  pressure    . metFORMIN (GLUCOPHAGE-XR) 500 MG 24 hr tablet Take 1 po BID with meals for 10 days, then 2 po BID with meals 60 tablet 2  . blood glucose meter kit and supplies Dispense based on patient and insurance preference. Use up to four times daily as directed. (FOR ICD-10 E10.9, E11.9). 1 each 0  . glucose blood (ONE TOUCH ULTRA TEST) test strip Use as instructed 100 each 3  . Insulin Pen Needle 31G X 5 MM MISC 1 Device by Does not apply route as directed. 100 each 0   No current facility-administered medications for this visit.     Patient confirms/reports the following allergies:  No Known Allergies  No orders of the defined types were placed in this encounter.   AUTHORIZATION INFORMATION Primary Insurance: [place] medicaid,  ID #: [id number] Pre-Cert / AuJosem Kaufmannequired: no   SCHEDULE INFORMATION: Procedure has been scheduled as follows:  Date: 02/11/18, Time: 10:30 Location: [facility] Dr.Fields  This Gastroenterology Pre-Precedure Review Form is being routed to the following provider(s): LeNeil CrouchPA

## 2017-12-25 ENCOUNTER — Ambulatory Visit: Payer: Self-pay | Admitting: Nutrition

## 2017-12-26 NOTE — Progress Notes (Signed)
OK to schedule. Day before TCS take 1/2 dose metformin (1 po BID). AM of TCS, hold metformin

## 2017-12-31 NOTE — Progress Notes (Signed)
Letter mailed to the pt with DM instructions 

## 2018-01-27 ENCOUNTER — Ambulatory Visit: Payer: Medicaid Other | Admitting: Nutrition

## 2018-01-27 ENCOUNTER — Ambulatory Visit: Payer: Medicaid Other | Admitting: "Endocrinology

## 2018-01-30 ENCOUNTER — Other Ambulatory Visit: Payer: Self-pay | Admitting: "Endocrinology

## 2018-01-31 LAB — COMPREHENSIVE METABOLIC PANEL
ALBUMIN: 4.5 g/dL (ref 3.5–5.5)
ALK PHOS: 91 IU/L (ref 39–117)
ALT: 21 IU/L (ref 0–44)
AST: 14 IU/L (ref 0–40)
Albumin/Globulin Ratio: 1.7 (ref 1.2–2.2)
BUN / CREAT RATIO: 13 (ref 9–20)
BUN: 18 mg/dL (ref 6–24)
Bilirubin Total: 0.2 mg/dL (ref 0.0–1.2)
CALCIUM: 10.1 mg/dL (ref 8.7–10.2)
CO2: 25 mmol/L (ref 20–29)
CREATININE: 1.43 mg/dL — AB (ref 0.76–1.27)
Chloride: 95 mmol/L — ABNORMAL LOW (ref 96–106)
GFR calc Af Amer: 64 mL/min/{1.73_m2} (ref >59)
GFR, EST NON AFRICAN AMERICAN: 55 mL/min/{1.73_m2} — AB (ref >59)
GLOBULIN, TOTAL: 2.6 g/dL (ref 1.5–4.5)
GLUCOSE: 432 mg/dL — AB (ref 65–99)
Potassium: 5 mmol/L (ref 3.5–5.2)
SODIUM: 138 mmol/L (ref 134–144)
TOTAL PROTEIN: 7.1 g/dL (ref 6.0–8.5)

## 2018-01-31 LAB — MICROALBUMIN / CREATININE URINE RATIO
CREATININE, UR: 139.1 mg/dL
Microalb/Creat Ratio: 7.8 mg/g creat (ref 0.0–30.0)
Microalbumin, Urine: 10.8 ug/mL

## 2018-01-31 LAB — HGB A1C W/O EAG: HEMOGLOBIN A1C: 8.7 % — AB (ref 4.8–5.6)

## 2018-01-31 LAB — SPECIMEN STATUS REPORT

## 2018-02-11 ENCOUNTER — Other Ambulatory Visit: Payer: Self-pay

## 2018-02-11 ENCOUNTER — Encounter (HOSPITAL_COMMUNITY): Payer: Self-pay | Admitting: *Deleted

## 2018-02-11 ENCOUNTER — Encounter (HOSPITAL_COMMUNITY)
Admission: RE | Disposition: A | Discharge status: Home / Self Care | Payer: Self-pay | Source: Ambulatory Visit | Attending: Gastroenterology

## 2018-02-11 ENCOUNTER — Ambulatory Visit (HOSPITAL_COMMUNITY)
Admission: RE | Admit: 2018-02-11 | Discharge: 2018-02-11 | Disposition: A | Discharge status: Home / Self Care | Payer: Medicaid Other | Source: Ambulatory Visit | Attending: Gastroenterology | Admitting: Gastroenterology

## 2018-02-11 DIAGNOSIS — K644 Residual hemorrhoidal skin tags: Secondary | ICD-10-CM | POA: Insufficient documentation

## 2018-02-11 DIAGNOSIS — D123 Benign neoplasm of transverse colon: Secondary | ICD-10-CM | POA: Diagnosis not present

## 2018-02-11 DIAGNOSIS — Z1211 Encounter for screening for malignant neoplasm of colon: Secondary | ICD-10-CM | POA: Insufficient documentation

## 2018-02-11 DIAGNOSIS — Q438 Other specified congenital malformations of intestine: Secondary | ICD-10-CM | POA: Diagnosis not present

## 2018-02-11 HISTORY — PX: POLYPECTOMY: SHX5525

## 2018-02-11 HISTORY — PX: COLONOSCOPY: SHX5424

## 2018-02-11 LAB — GLUCOSE, CAPILLARY: GLUCOSE-CAPILLARY: 157 mg/dL — AB (ref 65–99)

## 2018-02-11 SURGERY — COLONOSCOPY
Anesthesia: Moderate Sedation

## 2018-02-11 MED ORDER — MEPERIDINE HCL 100 MG/ML IJ SOLN
INTRAMUSCULAR | Status: DC | PRN
  Administered 2018-02-11 (×2): 25 mg via INTRAVENOUS
  Administered 2018-02-11: 50 mg via INTRAVENOUS

## 2018-02-11 MED ORDER — MEPERIDINE HCL 100 MG/ML IJ SOLN
INTRAMUSCULAR | Status: AC
  Filled 2018-02-11: qty 2

## 2018-02-11 MED ORDER — MIDAZOLAM HCL 5 MG/5ML IJ SOLN
INTRAMUSCULAR | Status: DC | PRN
  Administered 2018-02-11 (×3): 2 mg via INTRAVENOUS

## 2018-02-11 MED ORDER — MIDAZOLAM HCL 5 MG/5ML IJ SOLN
INTRAMUSCULAR | Status: AC
  Filled 2018-02-11: qty 10

## 2018-02-11 MED ORDER — SODIUM CHLORIDE 0.9 % IV SOLN
INTRAVENOUS | Status: DC
  Administered 2018-02-11: 10:00:00 via INTRAVENOUS

## 2018-02-11 MED ORDER — STERILE WATER FOR IRRIGATION IR SOLN
Status: DC | PRN
  Administered 2018-02-11: 15 mL

## 2018-02-11 NOTE — Op Note (Signed)
[facility] Patient Name: Gregory Smith Procedure Date: 02/11/2018 11:07 AM MRN: [id number] Date of Birth: 09/24/1960 Attending MD: Barney Drain MD, MD CSN: [id number] Age: 57 Admit Type: Outpatient Procedure:                Colonoscopy WITH COLD SNARE POLYPECTOMY Indications:              Screening for colorectal malignant neoplasm Providers:                Barney Drain MD, MD, Janeece Riggers, RN, Aram Candela Referring MD:             Teodora Medici. Kim Medicines:                Meperidine 100 mg IV, Midazolam 6 mg IV Complications:            No immediate complications. Estimated Blood Loss:     Estimated blood loss was minimal. Procedure:                Pre-Anesthesia Assessment:                           - Prior to the procedure, a History and Physical                            was performed, and patient medications and                            allergies were reviewed. The patient's tolerance of                            previous anesthesia was also reviewed. The risks                            and benefits of the procedure and the sedation                            options and risks were discussed with the patient.                            All questions were answered, and informed consent                            was obtained. Prior Anticoagulants: The patient has                            taken aspirin, last dose was 2 days prior to                            procedure. ASA Grade Assessment: II - A patient                            with mild systemic disease. After reviewing the                            risks and benefits, the patient was deemed in  satisfactory condition to undergo the procedure.                            After obtaining informed consent, the colonoscope                            was passed under direct vision. Throughout the                            procedure, the patient's blood pressure, pulse, and         oxygen saturations were monitored continuously. The                            EC-[id number] ([id number]) scope was introduced through                            the anus and advanced to the the cecum, identified                            by appendiceal orifice and ileocecal valve. The                            colonoscopy was somewhat difficult due to a                            tortuous colon. Successful completion of the                            procedure was aided by straightening and shortening                            the scope to obtain bowel loop reduction and                            COLOWRAP. The patient tolerated the procedure well.                            The quality of the bowel preparation was good. The                            ileocecal valve, appendiceal orifice, and rectum                            were photographed. Scope In: 11:46:06 AM Scope Out: 11:59:48 AM Scope Withdrawal Time: 0 hours 11 minutes 34 seconds  Total Procedure Duration: 0 hours 13 minutes 42 seconds  Findings:      Two sessile polyps were found in the hepatic flexure. The polyps were 3       to 4 mm in size. These polyps were removed with a cold snare. Resection       and retrieval were complete.      The recto-sigmoid colon, sigmoid colon and descending colon were       moderately tortuous.      External hemorrhoids were found  during retroflexion. The hemorrhoids       were moderate. Impression:               - Two 3 to 4 mm polyps at the hepatic flexure,                            removed with a cold snare. Resected and retrieved.                           - Tortuous LEFT colon.                           - MODERATE EXTERNAL hemorrhoids. Moderate Sedation:      Moderate (conscious) sedation was administered by the endoscopy nurse       and supervised by the endoscopist. The following parameters were       monitored: oxygen saturation, heart rate, blood pressure, and response       to  care. Total physician intraservice time was 32 minutes. Recommendation:           - Repeat colonoscopy in 5-10 years for surveillance.                           - High fiber diet. LOSE WEIGHT TO BMI < 30.                           - Continue present medications.                           - Await pathology results.                           - Patient has a contact number available for                            emergencies. The signs and symptoms of potential                            delayed complications were discussed with the                            patient. Return to normal activities tomorrow.                            Written discharge instructions were provided to the                            patient. Procedure Code(s):        --- Professional ---                           [phone], Colonoscopy, flexible; with removal of                            tumor(s), polyp(s), or other lesion(s) by snare  technique                           G0500, Moderate sedation services provided by the                            same physician or other qualified health care                            professional performing a gastrointestinal                            endoscopic service that sedation supports,                            requiring the presence of an independent trained                            observer to assist in the monitoring of the                            patient's level of consciousness and physiological                            status; initial 15 minutes of intra-service time;                            patient age 53 years or older (additional time may                            be reported with [phone], as appropriate)                           [phone], Moderate sedation services provided by the                            same physician or other qualified health care                            professional performing the diagnostic or                             therapeutic service that the sedation supports,                            requiring the presence of an independent trained                            observer to assist in the monitoring of the                            patient's level of consciousness and physiological                            status; each  additional 15 minutes intraservice                            time (List separately in addition to code for                            primary service) Diagnosis Code(s):        --- Professional ---                           Z12.11, Encounter for screening for malignant                            neoplasm of colon                           D12.3, Benign neoplasm of transverse colon (hepatic                            flexure or splenic flexure)                           K64.8, Other hemorrhoids                           Q43.8, Other specified congenital malformations of                            intestine CPT copyright 2017 [employer]. All rights reserved. The codes documented in this report are preliminary and upon coder review may  be revised to meet current compliance requirements. Barney Drain, MD Barney Drain MD, MD 02/11/2018 12:14:30 PM This report has been signed electronically. Number of Addenda: 0

## 2018-02-11 NOTE — Discharge Instructions (Signed)
You had 2 polyps removed. You have MODERATE SIZE EXTERNAL hemorrhoids.   DRINK WATER TO KEEP YOUR URINE LIGHT YELLOW.  CONTINUE YOUR WEIGHT LOSS EFFORTS. WHILE I DO NOT WANT TO ALARM YOU, OBESITY ACTIVATES CANCER GENES.   YOUR BODY MASS INDEX IS OVER 30 WHICH MEANS YOU ARE OBESE. OBESITY IS ASSOCIATED WITH AN INCREASED RISK FOR CIRRHOSIS AND ALL CANCERS, INCLUDING ESOPHAGEAL AND COLON CANCER. A WEIGHT OF    WILL GET YOUR BODY MASS INDEX(BMI) UNDER 30.  FOLLOW A HIGH FIBER DIET. AVOID ITEMS THAT CAUSE BLOATING & GAS. SEE INFO BELOW.  YOUR BIOPSY RESULTS WILL BE AVAILABLE IN 7 DAYS.  Next colonoscopy in 5-10 years.    Colonoscopy Care After Read the instructions outlined below and refer to this sheet in the next week. These discharge instructions provide you with general information on caring for yourself after you leave the hospital. While your treatment has been planned according to the most current medical practices available, unavoidable complications occasionally occur. If you have any problems or questions after discharge, call DR. Gerod Caligiuri, [phone].  ACTIVITY  You may resume your regular activity, but move at a slower pace for the next 24 hours.   Take frequent rest periods for the next 24 hours.   Walking will help get rid of the air and reduce the bloated feeling in your belly (abdomen).   No driving for 24 hours (because of the medicine (anesthesia) used during the test).   You may shower.   Do not sign any important legal documents or operate any machinery for 24 hours (because of the anesthesia used during the test).    NUTRITION  Drink plenty of fluids.   You may resume your normal diet as instructed by your doctor.   Begin with a light meal and progress to your normal diet. Heavy or fried foods are harder to digest and may make you feel sick to your stomach (nauseated).   Avoid alcoholic beverages for 24 hours or as instructed.    MEDICATIONS  You may  resume your normal medications.   WHAT YOU CAN EXPECT TODAY  Some feelings of bloating in the abdomen.   Passage of more gas than usual.   Spotting of blood in your stool or on the toilet paper  .  IF YOU HAD POLYPS REMOVED DURING THE COLONOSCOPY:  Eat a soft diet IF YOU HAVE NAUSEA, BLOATING, ABDOMINAL PAIN, OR VOMITING.    FINDING OUT THE RESULTS OF YOUR TEST Not all test results are available during your visit. DR. Oneida Alar WILL CALL YOU WITHIN 14 DAYS OF YOUR PROCEDUE WITH YOUR RESULTS. Do not assume everything is normal if you have not heard from DR. Zelma Mazariego, CALL HER OFFICE AT [phone].  SEEK IMMEDIATE MEDICAL ATTENTION AND CALL THE OFFICE: [phone] IF:  You have more than a spotting of blood in your stool.   Your belly is swollen (abdominal distention).   You are nauseated or vomiting.   You have a temperature over 101F.   You have abdominal pain or discomfort that is severe or gets worse throughout the day.   High-Fiber Diet A high-fiber diet changes your normal diet to include more whole grains, legumes, fruits, and vegetables. Changes in the diet involve replacing refined carbohydrates with unrefined foods. The calorie level of the diet is essentially unchanged. The Dietary Reference Intake (recommended amount) for adult males is 38 grams per day. For adult females, it is 25 grams per day. Pregnant and lactating women should consume  28 grams of fiber per day. Fiber is the intact part of a plant that is not broken down during digestion. Functional fiber is fiber that has been isolated from the plant to provide a beneficial effect in the body. PURPOSE  Increase stool bulk.   Ease and regulate bowel movements.   Lower cholesterol.   REDUCE RISK OF COLON CANCER  INDICATIONS THAT YOU NEED MORE FIBER  Constipation and hemorrhoids.   Uncomplicated diverticulosis (intestine condition) and irritable bowel syndrome.   Weight management.   As a protective  measure against hardening of the arteries (atherosclerosis), diabetes, and cancer.   GUIDELINES FOR INCREASING FIBER IN THE DIET  Start adding fiber to the diet slowly. A gradual increase of about 5 more grams (2 slices of whole-wheat bread, 2 servings of most fruits or vegetables, or 1 bowl of high-fiber cereal) per day is best. Too rapid an increase in fiber may result in constipation, flatulence, and bloating.   Drink enough water and fluids to keep your urine clear or pale yellow. Water, juice, or caffeine-free drinks are recommended. Not drinking enough fluid may cause constipation.   Eat a variety of high-fiber foods rather than one type of fiber.   Try to increase your intake of fiber through using high-fiber foods rather than fiber pills or supplements that contain small amounts of fiber.   The goal is to change the types of food eaten. Do not supplement your present diet with high-fiber foods, but replace foods in your present diet.   INCLUDE A VARIETY OF FIBER SOURCES  Replace refined and processed grains with whole grains, canned fruits with fresh fruits, and incorporate other fiber sources. White rice, white breads, and most bakery goods contain little or no fiber.   Brown whole-grain rice, buckwheat oats, and many fruits and vegetables are all good sources of fiber. These include: broccoli, Brussels sprouts, cabbage, cauliflower, beets, sweet potatoes, white potatoes (skin on), carrots, tomatoes, eggplant, squash, berries, fresh fruits, and dried fruits.   Cereals appear to be the richest source of fiber. Cereal fiber is found in whole grains and bran. Bran is the fiber-rich outer coat of cereal grain, which is largely removed in refining. In whole-grain cereals, the bran remains. In breakfast cereals, the largest amount of fiber is found in those with "bran" in their names. The fiber content is sometimes indicated on the label.   You may need to include additional fruits and  vegetables each day.   In baking, for 1 cup white flour, you may use the following substitutions:   1 cup whole-wheat flour minus 2 tablespoons.   1/2 cup white flour plus 1/2 cup whole-wheat flour.   Polyps, Colon  A polyp is extra tissue that grows inside your body. Colon polyps grow in the large intestine. The large intestine, also called the colon, is part of your digestive system. It is a long, hollow tube at the end of your digestive tract where your body makes and stores stool. Most polyps are not dangerous. They are benign. This means they are not cancerous. But over time, some types of polyps can turn into cancer. Polyps that are smaller than a pea are usually not harmful. But larger polyps could someday become or may already be cancerous. To be safe, doctors remove all polyps and test them.   WHO GETS POLYPS? Anyone can get polyps, but certain people are more likely than others. You may have a greater chance of getting polyps if:  You are  over 50.   You have had polyps before.   Someone in your family has had polyps.   Someone in your family has had cancer of the large intestine.   Find out if someone in your family has had polyps. You may also be more likely to get polyps if you:   Eat a lot of fatty foods   Smoke   Drink alcohol   Do not exercise  Eat too much   PREVENTION There is not one sure way to prevent polyps. You might be able to lower your risk of getting them if you:  Eat more fruits and vegetables and less fatty food.   Do not smoke.   Avoid alcohol.   Exercise every day.   Lose weight if you are overweight.   Eating more calcium and folate can also lower your risk of getting polyps. Some foods that are rich in calcium are milk, cheese, and broccoli. Some foods that are rich in folate are chickpeas, kidney beans, and spinach.   Hemorrhoids Hemorrhoids are dilated (enlarged) veins around the rectum. Sometimes clots will form in the veins. This  makes them swollen and painful. These are called thrombosed hemorrhoids. Causes of hemorrhoids include:  Constipation.   Straining to have a bowel movement.   HEAVY LIFTING  HOME CARE INSTRUCTIONS  Eat a well balanced diet and drink 6 to 8 glasses of water every day to avoid constipation. You may also use a bulk laxative.   Avoid straining to have bowel movements.   Keep anal area dry and clean.   Do not use a donut shaped pillow or sit on the toilet for long periods. This increases blood pooling and pain.   Move your bowels when your body has the urge; this will require less straining and will decrease pain and pressure.
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
## 2018-02-13 ENCOUNTER — Encounter (HOSPITAL_COMMUNITY): Payer: Self-pay | Admitting: Gastroenterology

## 2018-02-20 ENCOUNTER — Emergency Department (HOSPITAL_COMMUNITY)
Admission: EM | Admit: 2018-02-20 | Discharge: 2018-02-21 | Disposition: A | Discharge status: Home / Self Care | Payer: Medicaid Other | Attending: Emergency Medicine | Admitting: Emergency Medicine

## 2018-02-20 ENCOUNTER — Encounter (HOSPITAL_COMMUNITY): Payer: Self-pay

## 2018-02-20 ENCOUNTER — Emergency Department (HOSPITAL_COMMUNITY): Payer: Medicaid Other

## 2018-02-20 DIAGNOSIS — K2100 Gastro-esophageal reflux disease with esophagitis, without bleeding: Secondary | ICD-10-CM

## 2018-02-20 DIAGNOSIS — F1490 Cocaine use, unspecified, uncomplicated: Secondary | ICD-10-CM | POA: Diagnosis not present

## 2018-02-20 DIAGNOSIS — I10 Essential (primary) hypertension: Secondary | ICD-10-CM | POA: Insufficient documentation

## 2018-02-20 DIAGNOSIS — F17210 Nicotine dependence, cigarettes, uncomplicated: Secondary | ICD-10-CM | POA: Insufficient documentation

## 2018-02-20 DIAGNOSIS — Z7982 Long term (current) use of aspirin: Secondary | ICD-10-CM | POA: Diagnosis not present

## 2018-02-20 DIAGNOSIS — Z7902 Long term (current) use of antithrombotics/antiplatelets: Secondary | ICD-10-CM | POA: Diagnosis not present

## 2018-02-20 DIAGNOSIS — Z7984 Long term (current) use of oral hypoglycemic drugs: Secondary | ICD-10-CM | POA: Diagnosis not present

## 2018-02-20 DIAGNOSIS — E119 Type 2 diabetes mellitus without complications: Secondary | ICD-10-CM | POA: Diagnosis not present

## 2018-02-20 DIAGNOSIS — R0789 Other chest pain: Secondary | ICD-10-CM | POA: Diagnosis present

## 2018-02-20 DIAGNOSIS — I209 Angina pectoris, unspecified: Secondary | ICD-10-CM | POA: Diagnosis not present

## 2018-02-20 LAB — BASIC METABOLIC PANEL
Anion gap: 12 (ref 5–15)
BUN: 32 mg/dL — AB (ref 6–20)
CO2: 28 mmol/L (ref 22–32)
Calcium: 9.5 mg/dL (ref 8.9–10.3)
Chloride: 100 mmol/L — ABNORMAL LOW (ref 101–111)
Creatinine, Ser: 3.75 mg/dL — ABNORMAL HIGH (ref 0.61–1.24)
GFR calc Af Amer: 19 mL/min — ABNORMAL LOW (ref >60)
GFR, EST NON AFRICAN AMERICAN: 17 mL/min — AB (ref >60)
GLUCOSE: 215 mg/dL — AB (ref 65–99)
POTASSIUM: 4.7 mmol/L (ref 3.5–5.1)
Sodium: 140 mmol/L (ref 135–145)

## 2018-02-20 LAB — CBC
HEMATOCRIT: 49 % (ref 39.0–52.0)
Hemoglobin: 16.1 g/dL (ref 13.0–17.0)
MCH: 28.5 pg (ref 26.0–34.0)
MCHC: 32.9 g/dL (ref 30.0–36.0)
MCV: 86.9 fL (ref 78.0–100.0)
Platelets: 378 10*3/uL (ref 150–400)
RBC: 5.64 MIL/uL (ref 4.22–5.81)
RDW: 15.7 % — ABNORMAL HIGH (ref 11.5–15.5)
WBC: 10.7 10*3/uL — ABNORMAL HIGH (ref 4.0–10.5)

## 2018-02-20 LAB — TROPONIN I: Troponin I: <0.03 ng/mL

## 2018-02-20 MED ORDER — ONDANSETRON HCL 4 MG/2ML IJ SOLN
INTRAMUSCULAR | Status: AC
  Administered 2018-02-20: 4 mg via INTRAVENOUS
  Filled 2018-02-20: qty 2

## 2018-02-20 MED ORDER — FAMOTIDINE IN NACL 20-0.9 MG/50ML-% IV SOLN
INTRAVENOUS | Status: AC
  Administered 2018-02-20: 20 mg via INTRAVENOUS
  Filled 2018-02-20: qty 50

## 2018-02-20 MED ORDER — LORAZEPAM 2 MG/ML IJ SOLN
1.0000 mg | Freq: Once | INTRAMUSCULAR | Status: AC
  Administered 2018-02-20: 1 mg via INTRAVENOUS
  Filled 2018-02-20: qty 1

## 2018-02-20 MED ORDER — ONDANSETRON HCL 4 MG/2ML IJ SOLN
4.0000 mg | Freq: Once | INTRAMUSCULAR | Status: AC
  Administered 2018-02-20: 4 mg via INTRAVENOUS

## 2018-02-20 MED ORDER — LORAZEPAM 2 MG/ML IJ SOLN: 1.0000 mg | INTRAMUSCULAR | Status: DC | PRN

## 2018-02-20 MED ORDER — LACTATED RINGERS IV BOLUS
1000.0000 mL | Freq: Once | INTRAVENOUS | Status: AC
  Administered 2018-02-20: 1000 mL via INTRAVENOUS

## 2018-02-20 MED ORDER — ASPIRIN 81 MG PO CHEW
324.0000 mg | CHEWABLE_TABLET | Freq: Once | ORAL | Status: AC
  Administered 2018-02-20: 324 mg via ORAL
  Filled 2018-02-20: qty 4

## 2018-02-20 MED ORDER — SODIUM CHLORIDE 0.9 % IV BOLUS
500.0000 mL | Freq: Once | INTRAVENOUS | Status: AC
  Administered 2018-02-20: 500 mL via INTRAVENOUS

## 2018-02-20 MED ORDER — FAMOTIDINE IN NACL 20-0.9 MG/50ML-% IV SOLN
20.0000 mg | Freq: Once | INTRAVENOUS | Status: AC
  Administered 2018-02-20: 20 mg via INTRAVENOUS

## 2018-02-20 NOTE — ED Notes (Signed)
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  Pt c/o burning to chest like heartburn and had mention it to his PCP who he seen this morning.  Still c/o heartburn pain.

## 2018-02-20 NOTE — ED Triage Notes (Signed)
Pt in by [facility] for epigastric/chest pain "all day"  States he gave plasma earlier in the day.  Pt also has had some dizziness and nausea.

## 2018-02-20 NOTE — ED Notes (Signed)
Patient transported to X-ray 

## 2018-02-21 MED ORDER — FAMOTIDINE 20 MG PO TABS: 20.0000 mg | ORAL_TABLET | Freq: Two times a day (BID) | ORAL | 0 refills | Status: AC

## 2018-02-21 MED ORDER — CLOPIDOGREL BISULFATE 75 MG PO TABS: 75.0000 mg | ORAL_TABLET | Freq: Every day | ORAL | 1 refills | Status: DC

## 2018-02-21 NOTE — Discharge Instructions (Signed)
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
## 2018-02-25 ENCOUNTER — Telehealth: Payer: Self-pay | Admitting: Gastroenterology

## 2018-02-25 NOTE — Telephone Encounter (Signed)
LMOM for a return call.  

## 2018-02-25 NOTE — Telephone Encounter (Signed)
Please call pt. 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  FOLLOW A HIGH FIBER DIET. NEXT TCS IN 5 YEARS

## 2018-02-26 NOTE — Telephone Encounter (Signed)
LMOM to call and mailing a letter to call also.

## 2018-03-30 NOTE — Telephone Encounter (Addendum)
Patient made aware of results and recommendations 

## 2018-04-29 ENCOUNTER — Encounter: Payer: Self-pay | Admitting: Cardiology
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
## 2018-04-30 ENCOUNTER — Ambulatory Visit (INDEPENDENT_AMBULATORY_CARE_PROVIDER_SITE_OTHER): Payer: Medicaid Other | Admitting: Cardiology

## 2018-04-30 ENCOUNTER — Other Ambulatory Visit (HOSPITAL_COMMUNITY)
Admission: RE | Admit: 2018-04-30 | Discharge: 2018-04-30 | Disposition: A | Discharge status: Home / Self Care | Payer: Medicaid Other | Source: Ambulatory Visit | Attending: Cardiology | Admitting: Cardiology

## 2018-04-30 ENCOUNTER — Other Ambulatory Visit: Payer: Self-pay

## 2018-04-30 ENCOUNTER — Encounter: Payer: Self-pay | Admitting: Cardiology

## 2018-04-30 VITALS — BP 142/98 | HR 97 | Ht 72.0 in | Wt 292.0 lb

## 2018-04-30 DIAGNOSIS — I209 Angina pectoris, unspecified: Secondary | ICD-10-CM | POA: Diagnosis not present

## 2018-04-30 DIAGNOSIS — Z720 Tobacco use: Secondary | ICD-10-CM

## 2018-04-30 DIAGNOSIS — I25119 Atherosclerotic heart disease of native coronary artery with unspecified angina pectoris: Secondary | ICD-10-CM | POA: Insufficient documentation

## 2018-04-30 DIAGNOSIS — E782 Mixed hyperlipidemia: Secondary | ICD-10-CM

## 2018-04-30 DIAGNOSIS — I10 Essential (primary) hypertension: Secondary | ICD-10-CM

## 2018-04-30 DIAGNOSIS — E1165 Type 2 diabetes mellitus with hyperglycemia: Secondary | ICD-10-CM

## 2018-04-30 DIAGNOSIS — N289 Disorder of kidney and ureter, unspecified: Secondary | ICD-10-CM

## 2018-04-30 LAB — COMPREHENSIVE METABOLIC PANEL
ALBUMIN: 4.1 g/dL (ref 3.5–5.0)
ALK PHOS: 68 U/L (ref 38–126)
ALT: 28 U/L (ref 0–44)
ANION GAP: 8 (ref 5–15)
AST: 23 U/L (ref 15–41)
BILIRUBIN TOTAL: 0.6 mg/dL (ref 0.3–1.2)
BUN: 13 mg/dL (ref 6–20)
CALCIUM: 9.1 mg/dL (ref 8.9–10.3)
CO2: 24 mmol/L (ref 22–32)
Chloride: 108 mmol/L (ref 98–111)
Creatinine, Ser: 1.12 mg/dL (ref 0.61–1.24)
GFR calc non Af Amer: >60 mL/min (ref >60)
Glucose, Bld: 184 mg/dL — ABNORMAL HIGH (ref 70–99)
POTASSIUM: 4 mmol/L (ref 3.5–5.1)
Sodium: 140 mmol/L (ref 135–145)
TOTAL PROTEIN: 7.4 g/dL (ref 6.5–8.1)

## 2018-04-30 LAB — LIPID PANEL
CHOL/HDL RATIO: 7.7 ratio
CHOLESTEROL: 285 mg/dL — AB (ref 0–200)
HDL: 37 mg/dL — AB (ref >40)
LDL Cholesterol: 172 mg/dL — ABNORMAL HIGH (ref 0–99)
TRIGLYCERIDES: 381 mg/dL — AB (ref <150)
VLDL: 76 mg/dL — ABNORMAL HIGH (ref 0–40)

## 2018-04-30 MED ORDER — LISINOPRIL 10 MG PO TABS: 10.0000 mg | ORAL_TABLET | Freq: Every day | ORAL | 3 refills | Status: DC

## 2018-04-30 MED ORDER — CARVEDILOL 3.125 MG PO TABS: 3.1250 mg | ORAL_TABLET | Freq: Two times a day (BID) | ORAL | 3 refills | Status: DC

## 2018-04-30 MED ORDER — CLOPIDOGREL BISULFATE 75 MG PO TABS: 75.0000 mg | ORAL_TABLET | Freq: Every day | ORAL | 3 refills | Status: DC

## 2018-04-30 MED ORDER — ATORVASTATIN CALCIUM 80 MG PO TABS: 80.0000 mg | ORAL_TABLET | Freq: Every day | ORAL | 3 refills | Status: DC

## 2018-04-30 NOTE — Telephone Encounter (Signed)
Refilled cardiac meds

## 2018-04-30 NOTE — Addendum Note (Signed)
Addended by: Barbarann Ehlers A on: 04/30/2018 01:40 PM   Modules accepted: Orders

## 2018-04-30 NOTE — Patient Instructions (Signed)
Medication Instructions:  Your physician recommends that you continue on your current medications as directed. Please refer to the Current Medication list given to you today.   Labwork: Your physician recommends that you return for lab work in: Today    Testing/Procedures: Your physician has requested that you have an echocardiogram. Echocardiography is a painless test that uses sound waves to create images of your heart. It provides your doctor with information about the size and shape of your heart and how well your heart's chambers and valves are working. This procedure takes approximately one hour. There are no restrictions for this procedure.    Follow-Up: Your physician recommends that you schedule a follow-up appointment to be determined.    Any Other Special Instructions Will Be Listed Below (If Applicable).     If you need a refill on your cardiac medications before your next appointment, please call your pharmacy.

## 2018-05-11 ENCOUNTER — Ambulatory Visit (HOSPITAL_COMMUNITY): Payer: Medicaid Other | Attending: Cardiology

## 2018-06-16 ENCOUNTER — Ambulatory Visit: Payer: Medicaid Other | Admitting: Orthopaedic Surgery

## 2018-07-14 ENCOUNTER — Encounter: Payer: Self-pay | Admitting: Orthopaedic Surgery

## 2018-07-14 ENCOUNTER — Ambulatory Visit: Payer: Medicaid Other | Admitting: Orthopaedic Surgery

## 2018-07-18 ENCOUNTER — Observation Stay (HOSPITAL_COMMUNITY)
Admission: EM | Admit: 2018-07-18 | Discharge: 2018-07-19 | Disposition: A | Discharge status: Home / Self Care | Payer: Medicaid Other | Attending: Family Medicine | Admitting: Family Medicine

## 2018-07-18 ENCOUNTER — Emergency Department (HOSPITAL_COMMUNITY): Payer: Medicaid Other

## 2018-07-18 ENCOUNTER — Other Ambulatory Visit: Payer: Self-pay

## 2018-07-18 ENCOUNTER — Encounter (HOSPITAL_COMMUNITY): Payer: Self-pay | Admitting: Emergency Medicine

## 2018-07-18 DIAGNOSIS — I10 Essential (primary) hypertension: Secondary | ICD-10-CM | POA: Diagnosis not present

## 2018-07-18 DIAGNOSIS — I252 Old myocardial infarction: Secondary | ICD-10-CM | POA: Insufficient documentation

## 2018-07-18 DIAGNOSIS — Z7902 Long term (current) use of antithrombotics/antiplatelets: Secondary | ICD-10-CM | POA: Insufficient documentation

## 2018-07-18 DIAGNOSIS — E860 Dehydration: Secondary | ICD-10-CM | POA: Diagnosis not present

## 2018-07-18 DIAGNOSIS — Z7982 Long term (current) use of aspirin: Secondary | ICD-10-CM | POA: Insufficient documentation

## 2018-07-18 DIAGNOSIS — I2510 Atherosclerotic heart disease of native coronary artery without angina pectoris: Secondary | ICD-10-CM | POA: Diagnosis present

## 2018-07-18 DIAGNOSIS — Z7984 Long term (current) use of oral hypoglycemic drugs: Secondary | ICD-10-CM | POA: Diagnosis not present

## 2018-07-18 DIAGNOSIS — Z79899 Other long term (current) drug therapy: Secondary | ICD-10-CM | POA: Insufficient documentation

## 2018-07-18 DIAGNOSIS — K219 Gastro-esophageal reflux disease without esophagitis: Secondary | ICD-10-CM | POA: Insufficient documentation

## 2018-07-18 DIAGNOSIS — E871 Hypo-osmolality and hyponatremia: Secondary | ICD-10-CM | POA: Diagnosis not present

## 2018-07-18 DIAGNOSIS — F17210 Nicotine dependence, cigarettes, uncomplicated: Secondary | ICD-10-CM | POA: Diagnosis not present

## 2018-07-18 DIAGNOSIS — Z23 Encounter for immunization: Secondary | ICD-10-CM | POA: Diagnosis not present

## 2018-07-18 DIAGNOSIS — R739 Hyperglycemia, unspecified: Secondary | ICD-10-CM

## 2018-07-18 DIAGNOSIS — Z6838 Body mass index (BMI) 38.0-38.9, adult: Secondary | ICD-10-CM | POA: Diagnosis not present

## 2018-07-18 DIAGNOSIS — Z720 Tobacco use: Secondary | ICD-10-CM | POA: Diagnosis present

## 2018-07-18 DIAGNOSIS — Z791 Long term (current) use of non-steroidal anti-inflammatories (NSAID): Secondary | ICD-10-CM | POA: Insufficient documentation

## 2018-07-18 DIAGNOSIS — E1165 Type 2 diabetes mellitus with hyperglycemia: Secondary | ICD-10-CM | POA: Insufficient documentation

## 2018-07-18 DIAGNOSIS — E782 Mixed hyperlipidemia: Secondary | ICD-10-CM | POA: Insufficient documentation

## 2018-07-18 DIAGNOSIS — R0602 Shortness of breath: Secondary | ICD-10-CM | POA: Diagnosis present

## 2018-07-18 DIAGNOSIS — N179 Acute kidney failure, unspecified: Secondary | ICD-10-CM | POA: Diagnosis not present

## 2018-07-18 DIAGNOSIS — IMO0002 Reserved for concepts with insufficient information to code with codable children: Secondary | ICD-10-CM | POA: Diagnosis present

## 2018-07-18 LAB — URINALYSIS, ROUTINE W REFLEX MICROSCOPIC
BILIRUBIN URINE: NEGATIVE
Bacteria, UA: NONE SEEN
HGB URINE DIPSTICK: NEGATIVE
Ketones, ur: NEGATIVE mg/dL
Leukocytes, UA: NEGATIVE
Nitrite: NEGATIVE
PH: 5 (ref 5.0–8.0)
Protein, ur: NEGATIVE mg/dL
SPECIFIC GRAVITY, URINE: 1.023 (ref 1.005–1.030)

## 2018-07-18 LAB — CBG MONITORING, ED
GLUCOSE-CAPILLARY: 413 mg/dL — AB (ref 70–99)
Glucose-Capillary: >600 mg/dL (ref 70–99)
Glucose-Capillary: 495 mg/dL — ABNORMAL HIGH (ref 70–99)
Glucose-Capillary: 582 mg/dL (ref 70–99)
Glucose-Capillary: 383 mg/dL — ABNORMAL HIGH (ref 70–99)

## 2018-07-18 LAB — BASIC METABOLIC PANEL
ANION GAP: 8 (ref 5–15)
Anion gap: 10 (ref 5–15)
BUN: 22 mg/dL — ABNORMAL HIGH (ref 6–20)
BUN: 19 mg/dL (ref 6–20)
CHLORIDE: 93 mmol/L — AB (ref 98–111)
CHLORIDE: 99 mmol/L (ref 98–111)
CO2: 24 mmol/L (ref 22–32)
CO2: 26 mmol/L (ref 22–32)
Calcium: 9.4 mg/dL (ref 8.9–10.3)
Calcium: 9.1 mg/dL (ref 8.9–10.3)
Creatinine, Ser: 1.65 mg/dL — ABNORMAL HIGH (ref 0.61–1.24)
Creatinine, Ser: 1.49 mg/dL — ABNORMAL HIGH (ref 0.61–1.24)
GFR calc Af Amer: 52 mL/min — ABNORMAL LOW (ref >60)
GFR calc Af Amer: 59 mL/min — ABNORMAL LOW (ref >60)
GFR calc non Af Amer: 45 mL/min — ABNORMAL LOW (ref >60)
GFR, EST NON AFRICAN AMERICAN: 51 mL/min — AB (ref >60)
GLUCOSE: 783 mg/dL — AB (ref 70–99)
GLUCOSE: 418 mg/dL — AB (ref 70–99)
POTASSIUM: 4.3 mmol/L (ref 3.5–5.1)
Potassium: 4.8 mmol/L (ref 3.5–5.1)
Sodium: 127 mmol/L — ABNORMAL LOW (ref 135–145)
Sodium: 133 mmol/L — ABNORMAL LOW (ref 135–145)

## 2018-07-18 LAB — HEPATIC FUNCTION PANEL
ALT: 19 U/L (ref 0–44)
AST: 14 U/L — ABNORMAL LOW (ref 15–41)
Albumin: 4.2 g/dL (ref 3.5–5.0)
Alkaline Phosphatase: 77 U/L (ref 38–126)
BILIRUBIN TOTAL: 0.9 mg/dL (ref 0.3–1.2)
Total Protein: 7.5 g/dL (ref 6.5–8.1)

## 2018-07-18 LAB — CBC
HEMATOCRIT: 42.4 % (ref 39.0–52.0)
HEMOGLOBIN: 13.8 g/dL (ref 13.0–17.0)
MCH: 28.3 pg (ref 26.0–34.0)
MCHC: 32.5 g/dL (ref 30.0–36.0)
MCV: 87.1 fL (ref 80.0–100.0)
Platelets: 363 10*3/uL (ref 150–400)
RBC: 4.87 MIL/uL (ref 4.22–5.81)
RDW: 13.6 % (ref 11.5–15.5)
WBC: 5.8 10*3/uL (ref 4.0–10.5)
nRBC: 0 % (ref 0.0–0.2)

## 2018-07-18 LAB — GLUCOSE, CAPILLARY
Glucose-Capillary: 283 mg/dL — ABNORMAL HIGH (ref 70–99)
Glucose-Capillary: 300 mg/dL — ABNORMAL HIGH (ref 70–99)

## 2018-07-18 LAB — BETA-HYDROXYBUTYRIC ACID: Beta-Hydroxybutyric Acid: 0.4 mmol/L — ABNORMAL HIGH (ref 0.05–0.27)

## 2018-07-18 LAB — BRAIN NATRIURETIC PEPTIDE: B Natriuretic Peptide: 9 pg/mL (ref 0.0–100.0)

## 2018-07-18 LAB — TROPONIN I

## 2018-07-18 MED ORDER — ENOXAPARIN SODIUM 80 MG/0.8ML ~~LOC~~ SOLN
65.0000 mg | SUBCUTANEOUS | Status: DC
  Administered 2018-07-18: 65 mg via SUBCUTANEOUS
  Filled 2018-07-18: qty 0.8

## 2018-07-18 MED ORDER — POTASSIUM CHLORIDE 10 MEQ/100ML IV SOLN: 10.0000 meq | INTRAVENOUS | Status: AC

## 2018-07-18 MED ORDER — FAMOTIDINE IN NACL 20-0.9 MG/50ML-% IV SOLN: 20.0000 mg | Freq: Once | INTRAVENOUS | Status: DC

## 2018-07-18 MED ORDER — SODIUM CHLORIDE 0.9 % IV SOLN
INTRAVENOUS | Status: DC
  Administered 2018-07-18: 22:00:00 via INTRAVENOUS

## 2018-07-18 MED ORDER — ASPIRIN EC 81 MG PO TBEC
81.0000 mg | DELAYED_RELEASE_TABLET | Freq: Every day | ORAL | Status: DC
  Administered 2018-07-19: 81 mg via ORAL
  Filled 2018-07-18: qty 1

## 2018-07-18 MED ORDER — SODIUM CHLORIDE 0.9 % IV BOLUS
1000.0000 mL | Freq: Once | INTRAVENOUS | Status: AC
  Administered 2018-07-18: 1000 mL via INTRAVENOUS

## 2018-07-18 MED ORDER — PNEUMOCOCCAL VAC POLYVALENT 25 MCG/0.5ML IJ INJ
0.5000 mL | INJECTION | INTRAMUSCULAR | Status: AC
  Administered 2018-07-19: 0.5 mL via INTRAMUSCULAR
  Filled 2018-07-18: qty 0.5

## 2018-07-18 MED ORDER — CARVEDILOL 3.125 MG PO TABS
3.1250 mg | ORAL_TABLET | Freq: Two times a day (BID) | ORAL | Status: DC
  Administered 2018-07-19: 3.125 mg via ORAL
  Filled 2018-07-18 (×5): qty 1

## 2018-07-18 MED ORDER — PANTOPRAZOLE SODIUM 40 MG PO TBEC
40.0000 mg | DELAYED_RELEASE_TABLET | Freq: Every day | ORAL | Status: DC
  Administered 2018-07-19: 40 mg via ORAL
  Filled 2018-07-18: qty 1

## 2018-07-18 MED ORDER — SODIUM CHLORIDE 0.9 % IV SOLN
Freq: Once | INTRAVENOUS | Status: AC
  Administered 2018-07-18: 19:00:00 via INTRAVENOUS

## 2018-07-18 MED ORDER — FAMOTIDINE 20 MG PO TABS: 20.0000 mg | ORAL_TABLET | Freq: Two times a day (BID) | ORAL | Status: DC

## 2018-07-18 MED ORDER — ACETAMINOPHEN 325 MG PO TABS: 650.0000 mg | ORAL_TABLET | Freq: Four times a day (QID) | ORAL | Status: DC | PRN

## 2018-07-18 MED ORDER — INFLUENZA VAC SPLIT QUAD 0.5 ML IM SUSY
0.5000 mL | PREFILLED_SYRINGE | INTRAMUSCULAR | Status: AC
  Administered 2018-07-19: 0.5 mL via INTRAMUSCULAR
  Filled 2018-07-18: qty 0.5

## 2018-07-18 MED ORDER — ATORVASTATIN CALCIUM 40 MG PO TABS: 80.0000 mg | ORAL_TABLET | Freq: Every day | ORAL | Status: DC

## 2018-07-18 MED ORDER — HYDRALAZINE HCL 20 MG/ML IJ SOLN: 10.0000 mg | INTRAMUSCULAR | Status: DC | PRN

## 2018-07-18 MED ORDER — HYDROCODONE-ACETAMINOPHEN 5-325 MG PO TABS
1.0000 | ORAL_TABLET | Freq: Four times a day (QID) | ORAL | Status: DC | PRN
  Administered 2018-07-18 – 2018-07-19 (×2): 2 via ORAL
  Filled 2018-07-18 (×2): qty 2

## 2018-07-18 MED ORDER — INSULIN REGULAR(HUMAN) IN NACL 100-0.9 UT/100ML-% IV SOLN
INTRAVENOUS | Status: DC
  Administered 2018-07-18: 5.2 [IU]/h via INTRAVENOUS
  Filled 2018-07-18 (×2): qty 100

## 2018-07-18 MED ORDER — CLOPIDOGREL BISULFATE 75 MG PO TABS
75.0000 mg | ORAL_TABLET | Freq: Every day | ORAL | Status: DC
  Administered 2018-07-19: 75 mg via ORAL
  Filled 2018-07-18: qty 1

## 2018-07-18 MED ORDER — DEXTROSE-NACL 5-0.45 % IV SOLN
INTRAVENOUS | Status: DC
  Administered 2018-07-19: via INTRAVENOUS

## 2018-07-18 MED ORDER — INSULIN REGULAR(HUMAN) IN NACL 100-0.9 UT/100ML-% IV SOLN: INTRAVENOUS | Status: DC

## 2018-07-18 MED ORDER — ALUM & MAG HYDROXIDE-SIMETH 200-200-20 MG/5ML PO SUSP
30.0000 mL | Freq: Once | ORAL | Status: AC
  Administered 2018-07-18: 30 mL via ORAL
  Filled 2018-07-18: qty 30

## 2018-07-18 MED ORDER — DEXTROSE-NACL 5-0.45 % IV SOLN: INTRAVENOUS | Status: DC

## 2018-07-18 MED ORDER — FAMOTIDINE 20 MG PO TABS
20.0000 mg | ORAL_TABLET | Freq: Two times a day (BID) | ORAL | Status: DC
  Administered 2018-07-19: 20 mg via ORAL
  Filled 2018-07-18: qty 1

## 2018-07-18 MED ORDER — ALBUTEROL SULFATE (2.5 MG/3ML) 0.083% IN NEBU: 3.0000 mL | INHALATION_SOLUTION | Freq: Four times a day (QID) | RESPIRATORY_TRACT | Status: DC | PRN

## 2018-07-18 NOTE — ED Triage Notes (Signed)
Out of meds has not taken insulin in several days

## 2018-07-18 NOTE — ED Notes (Signed)
Pt reports that he is supposed to be on a weekly injectable diabetes medication but has been out for a month.  He also states that he has been out of most of his medications for the month.  Pt reports that money problems are the reason he has not had his medications.  Pt also reports that he has been eating "lots of little debbies cakes and skittles" during the last week and he has been having lots of thirst and frequent urination.  He states that he has been drinking 8+ bottles of water a day but continues to be thirsty.
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
## 2018-07-18 NOTE — ED Notes (Signed)
Date and time results received: 07/18/18 1801 (use smartphrase ".now" to insert current time)  Test: Glucose Critical Value: 783  Name of Provider Notified: Dr Laverta Baltimore  Orders Received? Or Actions Taken?: NA

## 2018-07-18 NOTE — ED Notes (Signed)
AC is working on Insulin drip.  Pt is in no distress, updated pt.

## 2018-07-18 NOTE — ED Triage Notes (Signed)
States for the last 4-5 days his machine has read high
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
## 2018-07-18 NOTE — ED Triage Notes (Signed)
Pt reports he has  Insulin at home, but has not taken because he did not know if he should take it because he had been on once weekly insulin and had run out  He states he also taken metformin

## 2018-07-19 DIAGNOSIS — E1165 Type 2 diabetes mellitus with hyperglycemia: Secondary | ICD-10-CM

## 2018-07-19 DIAGNOSIS — Z720 Tobacco use: Secondary | ICD-10-CM

## 2018-07-19 DIAGNOSIS — I2510 Atherosclerotic heart disease of native coronary artery without angina pectoris: Secondary | ICD-10-CM

## 2018-07-19 LAB — BASIC METABOLIC PANEL
ANION GAP: 8 (ref 5–15)
ANION GAP: 5 (ref 5–15)
Anion gap: 7 (ref 5–15)
BUN: 15 mg/dL (ref 6–20)
BUN: 14 mg/dL (ref 6–20)
BUN: 12 mg/dL (ref 6–20)
CHLORIDE: 105 mmol/L (ref 98–111)
CHLORIDE: 106 mmol/L (ref 98–111)
CHLORIDE: 105 mmol/L (ref 98–111)
CO2: 23 mmol/L (ref 22–32)
CO2: 27 mmol/L (ref 22–32)
CO2: 26 mmol/L (ref 22–32)
CREATININE: 1.21 mg/dL (ref 0.61–1.24)
CREATININE: 1.07 mg/dL (ref 0.61–1.24)
Calcium: 8.7 mg/dL — ABNORMAL LOW (ref 8.9–10.3)
Calcium: 8.6 mg/dL — ABNORMAL LOW (ref 8.9–10.3)
Calcium: 8.4 mg/dL — ABNORMAL LOW (ref 8.9–10.3)
Creatinine, Ser: 1.28 mg/dL — ABNORMAL HIGH (ref 0.61–1.24)
GFR calc non Af Amer: >60 mL/min (ref >60)
GFR calc non Af Amer: >60 mL/min (ref >60)
GFR calc non Af Amer: >60 mL/min (ref >60)
Glucose, Bld: 239 mg/dL — ABNORMAL HIGH (ref 70–99)
Glucose, Bld: 117 mg/dL — ABNORMAL HIGH (ref 70–99)
Glucose, Bld: 136 mg/dL — ABNORMAL HIGH (ref 70–99)
POTASSIUM: 3.8 mmol/L (ref 3.5–5.1)
POTASSIUM: 4.2 mmol/L (ref 3.5–5.1)
POTASSIUM: 3.8 mmol/L (ref 3.5–5.1)
SODIUM: 136 mmol/L (ref 135–145)
SODIUM: 138 mmol/L (ref 135–145)
SODIUM: 138 mmol/L (ref 135–145)

## 2018-07-19 LAB — GLUCOSE, CAPILLARY
GLUCOSE-CAPILLARY: 208 mg/dL — AB (ref 70–99)
GLUCOSE-CAPILLARY: 144 mg/dL — AB (ref 70–99)
GLUCOSE-CAPILLARY: 154 mg/dL — AB (ref 70–99)
GLUCOSE-CAPILLARY: 190 mg/dL — AB (ref 70–99)
Glucose-Capillary: 104 mg/dL — ABNORMAL HIGH (ref 70–99)
Glucose-Capillary: 129 mg/dL — ABNORMAL HIGH (ref 70–99)
Glucose-Capillary: 143 mg/dL — ABNORMAL HIGH (ref 70–99)
Glucose-Capillary: 145 mg/dL — ABNORMAL HIGH (ref 70–99)
Glucose-Capillary: 241 mg/dL — ABNORMAL HIGH (ref 70–99)
Glucose-Capillary: 268 mg/dL — ABNORMAL HIGH (ref 70–99)
Glucose-Capillary: 278 mg/dL — ABNORMAL HIGH (ref 70–99)
Glucose-Capillary: 285 mg/dL — ABNORMAL HIGH (ref 70–99)

## 2018-07-19 LAB — MRSA PCR SCREENING: MRSA BY PCR: NEGATIVE

## 2018-07-19 LAB — HEMOGLOBIN A1C
Hgb A1c MFr Bld: 11 % — ABNORMAL HIGH (ref 4.8–5.6)
Mean Plasma Glucose: 269 mg/dL

## 2018-07-19 MED ORDER — INSULIN ASPART 100 UNIT/ML ~~LOC~~ SOLN: 0.0000 [IU] | Freq: Three times a day (TID) | SUBCUTANEOUS | Status: DC

## 2018-07-19 MED ORDER — INSULIN DETEMIR 100 UNIT/ML FLEXPEN: 18.0000 [IU] | Freq: Two times a day (BID) | SUBCUTANEOUS | 5 refills | Status: AC

## 2018-07-19 MED ORDER — SODIUM CHLORIDE 0.9 % IV SOLN
INTRAVENOUS | Status: DC
  Administered 2018-07-19: 09:00:00 via INTRAVENOUS

## 2018-07-19 MED ORDER — OMEPRAZOLE 20 MG PO CPDR: 20.0000 mg | DELAYED_RELEASE_CAPSULE | Freq: Every day | ORAL | 5 refills | Status: AC

## 2018-07-19 MED ORDER — INSULIN ASPART 100 UNIT/ML ~~LOC~~ SOLN
4.0000 [IU] | Freq: Three times a day (TID) | SUBCUTANEOUS | Status: DC
  Administered 2018-07-19 (×2): 4 [IU] via SUBCUTANEOUS

## 2018-07-19 MED ORDER — INSULIN GLARGINE 100 UNIT/ML ~~LOC~~ SOLN
12.0000 [IU] | Freq: Once | SUBCUTANEOUS | Status: AC
  Administered 2018-07-19: 12 [IU] via SUBCUTANEOUS
  Filled 2018-07-19: qty 0.12

## 2018-07-19 MED ORDER — BLOOD GLUCOSE METER KIT: PACK | 3 refills | Status: DC

## 2018-07-19 MED ORDER — INSULIN ASPART 100 UNIT/ML ~~LOC~~ SOLN
0.0000 [IU] | Freq: Three times a day (TID) | SUBCUTANEOUS | Status: DC
  Administered 2018-07-19: 3 [IU] via SUBCUTANEOUS
  Administered 2018-07-19: 2 [IU] via SUBCUTANEOUS

## 2018-07-19 MED ORDER — ASPIRIN EC 81 MG PO TBEC: 81.0000 mg | DELAYED_RELEASE_TABLET | Freq: Every day | ORAL | 3 refills | Status: AC

## 2018-07-19 MED ORDER — INSULIN ASPART 100 UNIT/ML ~~LOC~~ SOLN: 0.0000 [IU] | Freq: Every day | SUBCUTANEOUS | Status: DC

## 2018-07-19 MED ORDER — LISINOPRIL 10 MG PO TABS: 10.0000 mg | ORAL_TABLET | Freq: Every day | ORAL | 5 refills | Status: DC

## 2018-07-19 MED ORDER — METFORMIN HCL 1000 MG PO TABS: 1000.0000 mg | ORAL_TABLET | Freq: Two times a day (BID) | ORAL | 11 refills | Status: AC

## 2018-07-19 MED ORDER — INSULIN ASPART 100 UNIT/ML FLEXPEN: 3.0000 [IU] | PEN_INJECTOR | Freq: Three times a day (TID) | SUBCUTANEOUS | 11 refills | Status: AC

## 2018-07-19 MED ORDER — CLOPIDOGREL BISULFATE 75 MG PO TABS: 75.0000 mg | ORAL_TABLET | Freq: Every day | ORAL | 5 refills | Status: AC

## 2018-07-19 MED ORDER — ATORVASTATIN CALCIUM 80 MG PO TABS: 80.0000 mg | ORAL_TABLET | Freq: Every evening | ORAL | 5 refills | Status: AC

## 2018-07-19 MED ORDER — CARVEDILOL 3.125 MG PO TABS: 3.1250 mg | ORAL_TABLET | Freq: Two times a day (BID) | ORAL | 5 refills | Status: AC

## 2018-07-19 MED ORDER — INSULIN GLARGINE 100 UNIT/ML ~~LOC~~ SOLN
8.0000 [IU] | SUBCUTANEOUS | Status: DC
  Administered 2018-07-19: 8 [IU] via SUBCUTANEOUS
  Filled 2018-07-19 (×2): qty 0.08

## 2018-07-19 NOTE — Care Management (Signed)
Consult received for medication needs.  Pt has Medicaid and PCP.  Medications will be $3-$4.

## 2018-07-19 NOTE — Progress Notes (Signed)
Pt was discharged from facility with stable vital signs and no complaints of pain. Discharge instructions were went over with patient especially the importance of how to take his insulins and other medications and the need for compliance with them. Pt demonstrated understanding of the information. He demonstrated proper ways to check blood glucose and how to draw up and administer insulin.    Jeris Penta, RN
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
## 2018-07-19 NOTE — Discharge Instructions (Signed)
1) your diabetes is out of control---- 2) take Levemir insulin injections 18 units twice a day (this is long-acting insulin) 3) take NovoLog insulin 3 units with each meal as long as her blood sugar before you eat is 110 or higher (this is short acting insulin) 4) take Coreg, Lipitor and lisinopril as well as aspirin and Plavix for your heart as prescribed 5) quit smoking 6) follow-up with your primary care doctor within 3 days for reevaluation and recheck--- take your blood glucose diary/Record with you when you follow-up with your doctor 7) you are taking aspirin and Plavix which would make your blood thin so Avoid ibuprofen/Advil/Aleve/Motrin/Goody Powders/Naproxen/BC powders/Meloxicam/Diclofenac/Indomethacin and other Nonsteroidal anti-inflammatory medications as these will make you more likely to bleed and can cause stomach ulcers, can also cause Kidney problems.  8) your primary care doctor can refer you to an endocrinologist for further management and diabetic education

## 2018-07-28 ENCOUNTER — Ambulatory Visit: Payer: Medicaid Other | Admitting: Orthopaedic Surgery

## 2018-07-28 ENCOUNTER — Encounter: Payer: Self-pay | Admitting: Orthopaedic Surgery

## 2018-09-01 ENCOUNTER — Ambulatory Visit: Payer: Medicaid Other | Admitting: "Endocrinology

## 2019-01-25 IMAGING — DX DG CHEST 2V
2 series · 2 of 2 positions shown · non-contrast
Comparison: 02/20/2018

CLINICAL DATA: Hyperglycemia for several days with shortness of
breath

EXAM:
CHEST - 2 VIEW

[chest pa]
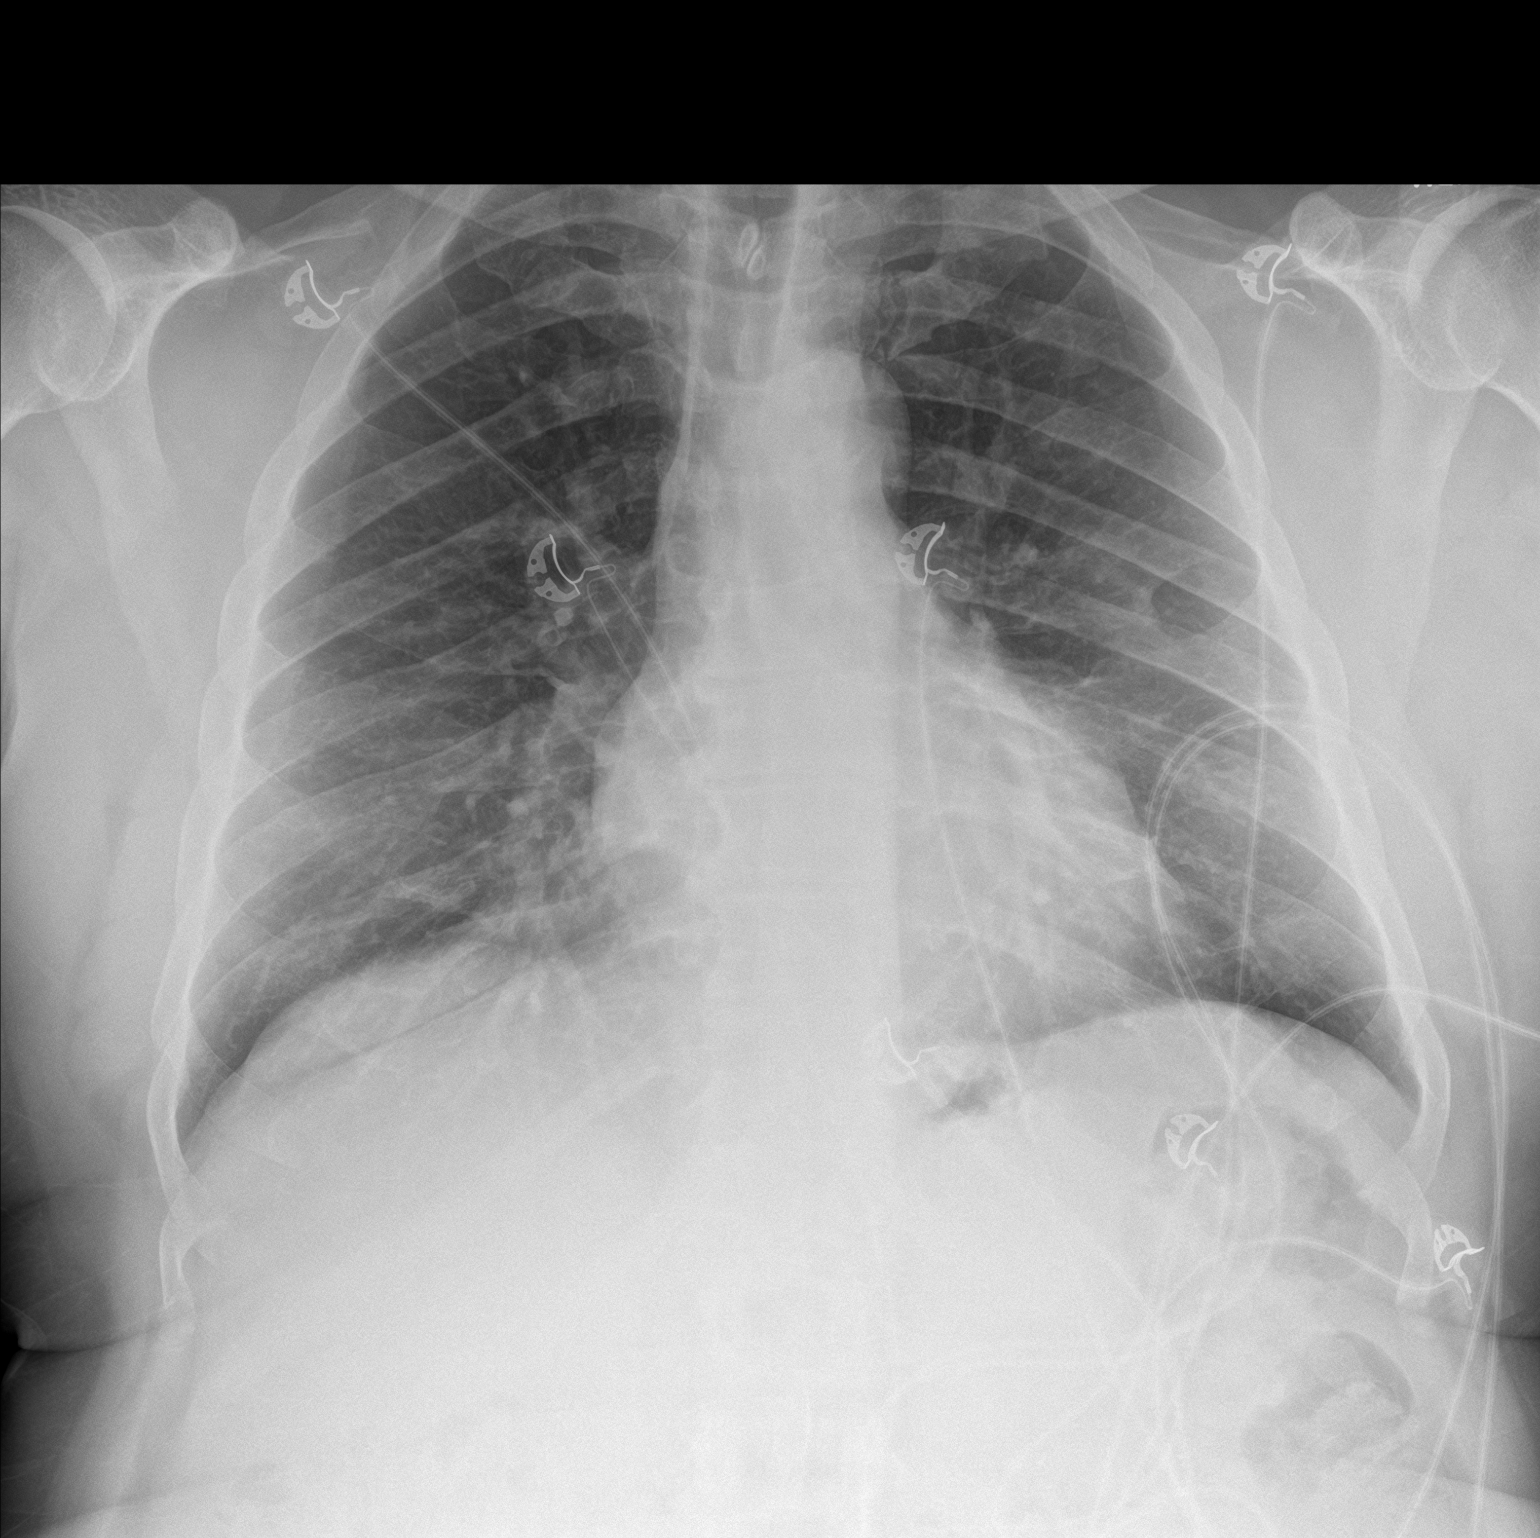

[chest lat]
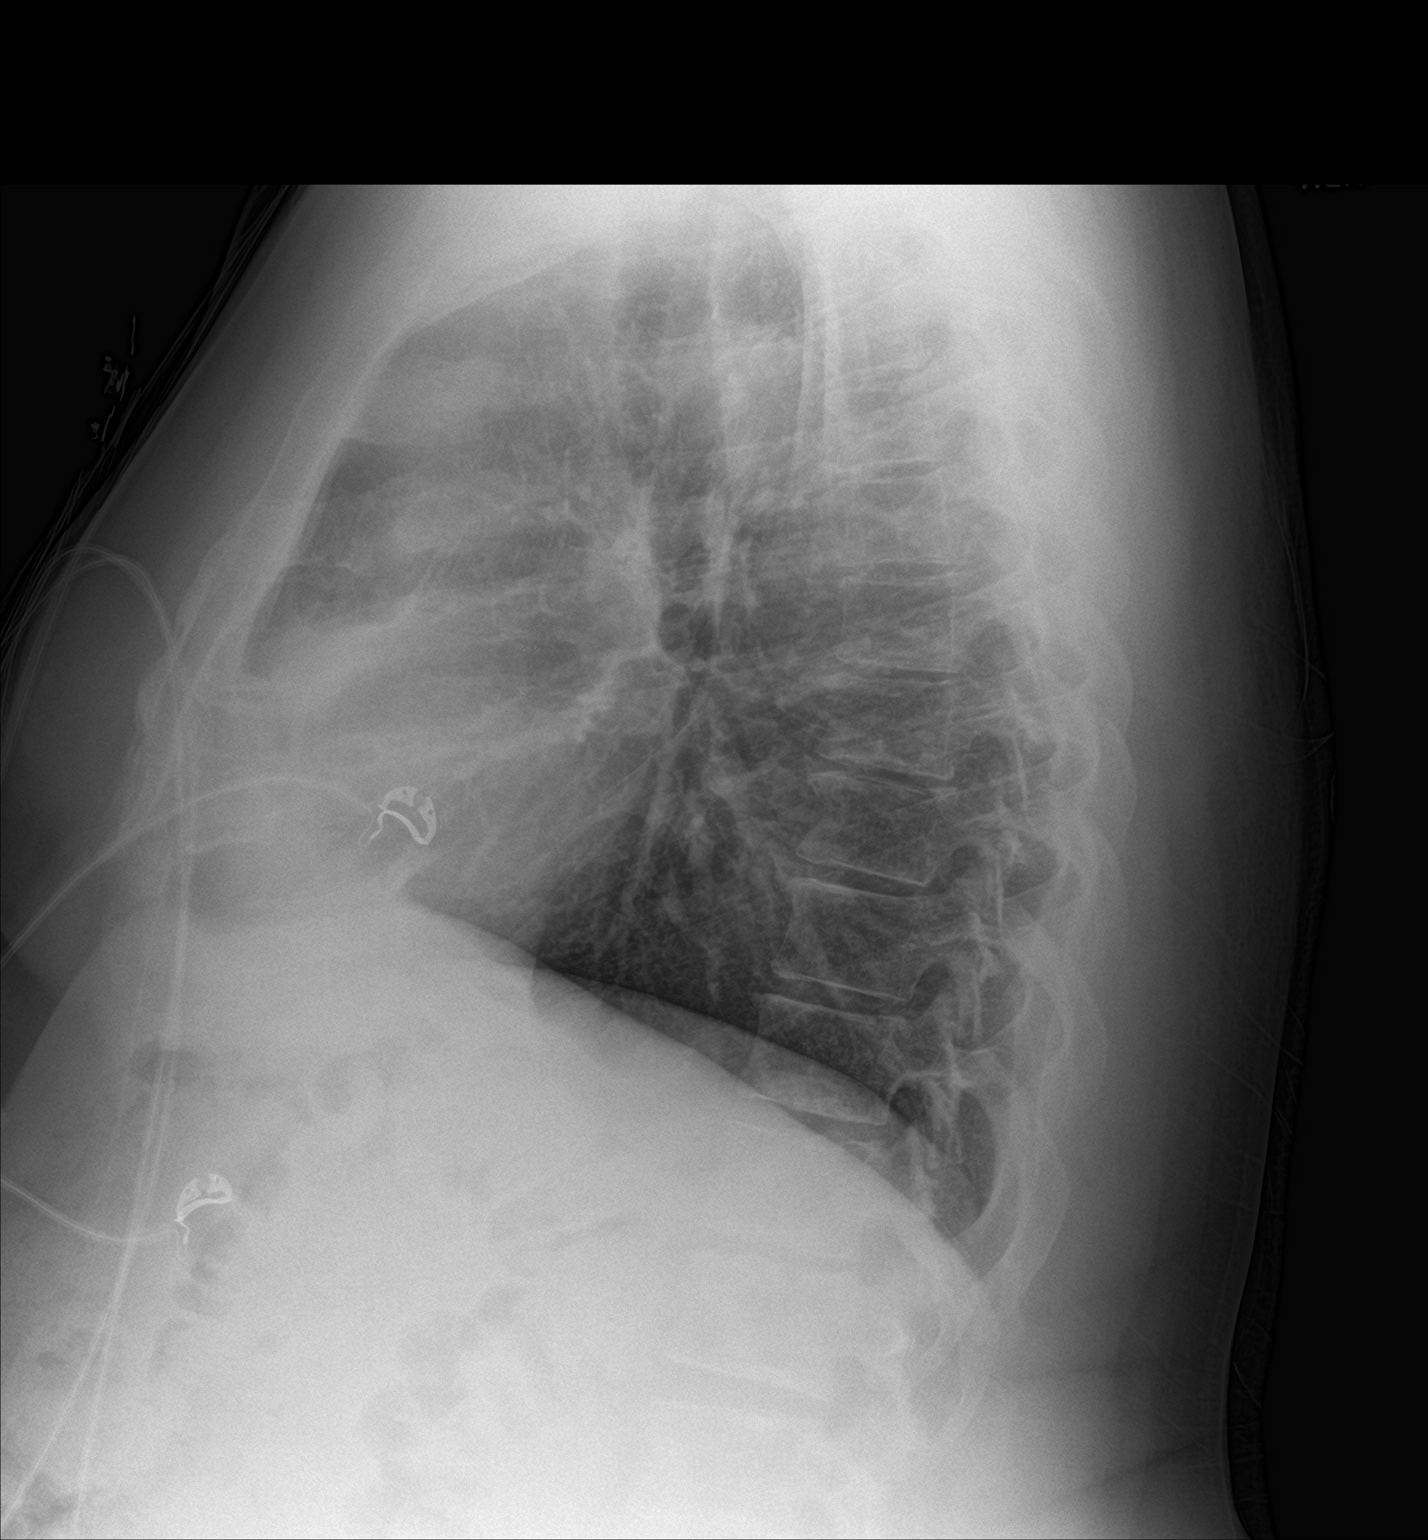

[2 of 2 positions shown; findings below may reference images not displayed]

FINDINGS: Cardiac shadow is within normal limits. The lungs are well aerated
bilaterally. No focal infiltrate or sizable effusion is noted. No
bony abnormality is noted.
IMPRESSION: No acute abnormality noted.  No change from the prior study

## 2019-02-05 ENCOUNTER — Inpatient Hospital Stay (HOSPITAL_COMMUNITY)
Admission: EM | Admit: 2019-02-05 | Discharge: 2019-02-08 | DRG: 682 | Disposition: A | Discharge status: Home / Self Care | Payer: Medicaid Other | Attending: Internal Medicine | Admitting: Internal Medicine

## 2019-02-05 ENCOUNTER — Emergency Department (HOSPITAL_COMMUNITY): Payer: Medicaid Other

## 2019-02-05 ENCOUNTER — Other Ambulatory Visit: Payer: Self-pay

## 2019-02-05 ENCOUNTER — Encounter (HOSPITAL_COMMUNITY): Payer: Self-pay | Admitting: Emergency Medicine

## 2019-02-05 DIAGNOSIS — E860 Dehydration: Secondary | ICD-10-CM | POA: Diagnosis present

## 2019-02-05 DIAGNOSIS — K219 Gastro-esophageal reflux disease without esophagitis: Secondary | ICD-10-CM | POA: Diagnosis present

## 2019-02-05 DIAGNOSIS — S60471A Other superficial bite of left index finger, initial encounter: Secondary | ICD-10-CM | POA: Diagnosis present

## 2019-02-05 DIAGNOSIS — N179 Acute kidney failure, unspecified: Principal | ICD-10-CM | POA: Diagnosis present

## 2019-02-05 DIAGNOSIS — I959 Hypotension, unspecified: Secondary | ICD-10-CM | POA: Diagnosis not present

## 2019-02-05 DIAGNOSIS — E876 Hypokalemia: Secondary | ICD-10-CM | POA: Diagnosis not present

## 2019-02-05 DIAGNOSIS — I10 Essential (primary) hypertension: Secondary | ICD-10-CM | POA: Diagnosis not present

## 2019-02-05 DIAGNOSIS — T464X5A Adverse effect of angiotensin-converting-enzyme inhibitors, initial encounter: Secondary | ICD-10-CM | POA: Diagnosis present

## 2019-02-05 DIAGNOSIS — Z833 Family history of diabetes mellitus: Secondary | ICD-10-CM | POA: Diagnosis not present

## 2019-02-05 DIAGNOSIS — Z20828 Contact with and (suspected) exposure to other viral communicable diseases: Secondary | ICD-10-CM | POA: Diagnosis present

## 2019-02-05 DIAGNOSIS — F17210 Nicotine dependence, cigarettes, uncomplicated: Secondary | ICD-10-CM | POA: Diagnosis present

## 2019-02-05 DIAGNOSIS — E1165 Type 2 diabetes mellitus with hyperglycemia: Secondary | ICD-10-CM | POA: Diagnosis not present

## 2019-02-05 DIAGNOSIS — E875 Hyperkalemia: Secondary | ICD-10-CM | POA: Diagnosis present

## 2019-02-05 DIAGNOSIS — IMO0002 Reserved for concepts with insufficient information to code with codable children: Secondary | ICD-10-CM | POA: Diagnosis present

## 2019-02-05 DIAGNOSIS — Z9111 Patient's noncompliance with dietary regimen: Secondary | ICD-10-CM

## 2019-02-05 DIAGNOSIS — I2510 Atherosclerotic heart disease of native coronary artery without angina pectoris: Secondary | ICD-10-CM | POA: Diagnosis present

## 2019-02-05 DIAGNOSIS — W5501XA Bitten by cat, initial encounter: Secondary | ICD-10-CM | POA: Diagnosis not present

## 2019-02-05 DIAGNOSIS — Z794 Long term (current) use of insulin: Secondary | ICD-10-CM | POA: Diagnosis not present

## 2019-02-05 DIAGNOSIS — E782 Mixed hyperlipidemia: Secondary | ICD-10-CM | POA: Diagnosis not present

## 2019-02-05 DIAGNOSIS — I252 Old myocardial infarction: Secondary | ICD-10-CM

## 2019-02-05 DIAGNOSIS — E1110 Type 2 diabetes mellitus with ketoacidosis without coma: Secondary | ICD-10-CM | POA: Diagnosis present

## 2019-02-05 DIAGNOSIS — I447 Left bundle-branch block, unspecified: Secondary | ICD-10-CM | POA: Diagnosis present

## 2019-02-05 DIAGNOSIS — R34 Anuria and oliguria: Secondary | ICD-10-CM | POA: Diagnosis present

## 2019-02-05 DIAGNOSIS — I517 Cardiomegaly: Secondary | ICD-10-CM | POA: Diagnosis not present

## 2019-02-05 DIAGNOSIS — Z7982 Long term (current) use of aspirin: Secondary | ICD-10-CM

## 2019-02-05 DIAGNOSIS — Z7902 Long term (current) use of antithrombotics/antiplatelets: Secondary | ICD-10-CM | POA: Diagnosis not present

## 2019-02-05 LAB — CBC WITH DIFFERENTIAL/PLATELET
Abs Immature Granulocytes: 0.03 10*3/uL (ref 0.00–0.07)
Basophils Absolute: 0 10*3/uL (ref 0.0–0.1)
Basophils Relative: 1 %
Eosinophils Absolute: 0.2 10*3/uL (ref 0.0–0.5)
Eosinophils Relative: 3 %
HCT: 42.6 % (ref 39.0–52.0)
Hemoglobin: 13.7 g/dL (ref 13.0–17.0)
Immature Granulocytes: 0 %
Lymphocytes Relative: 22 %
Lymphs Abs: 1.6 10*3/uL (ref 0.7–4.0)
MCH: 27.9 pg (ref 26.0–34.0)
MCHC: 32.2 g/dL (ref 30.0–36.0)
MCV: 86.8 fL (ref 80.0–100.0)
Monocytes Absolute: 0.8 10*3/uL (ref 0.1–1.0)
Monocytes Relative: 11 %
Neutro Abs: 4.6 10*3/uL (ref 1.7–7.7)
Neutrophils Relative %: 63 %
Platelets: 290 10*3/uL (ref 150–400)
RBC: 4.91 MIL/uL (ref 4.22–5.81)
RDW: 15.6 % — ABNORMAL HIGH (ref 11.5–15.5)
WBC: 7.2 10*3/uL (ref 4.0–10.5)
nRBC: 0 % (ref 0.0–0.2)

## 2019-02-05 LAB — BASIC METABOLIC PANEL
Anion gap: 19 — ABNORMAL HIGH (ref 5–15)
Anion gap: 21 — ABNORMAL HIGH (ref 5–15)
BUN: 81 mg/dL — ABNORMAL HIGH (ref 6–20)
BUN: 83 mg/dL — ABNORMAL HIGH (ref 6–20)
CO2: 20 mmol/L — ABNORMAL LOW (ref 22–32)
CO2: 19 mmol/L — ABNORMAL LOW (ref 22–32)
Calcium: 7.9 mg/dL — ABNORMAL LOW (ref 8.9–10.3)
Calcium: 7.7 mg/dL — ABNORMAL LOW (ref 8.9–10.3)
Chloride: 93 mmol/L — ABNORMAL LOW (ref 98–111)
Chloride: 95 mmol/L — ABNORMAL LOW (ref 98–111)
Creatinine, Ser: 12.04 mg/dL — ABNORMAL HIGH (ref 0.61–1.24)
Creatinine, Ser: 12.22 mg/dL — ABNORMAL HIGH (ref 0.61–1.24)
GFR calc Af Amer: 5 mL/min — ABNORMAL LOW (ref >60)
GFR calc Af Amer: 5 mL/min — ABNORMAL LOW (ref >60)
GFR calc non Af Amer: 4 mL/min — ABNORMAL LOW (ref >60)
GFR calc non Af Amer: 4 mL/min — ABNORMAL LOW (ref >60)
Glucose, Bld: 367 mg/dL — ABNORMAL HIGH (ref 70–99)
Glucose, Bld: 258 mg/dL — ABNORMAL HIGH (ref 70–99)
Potassium: 5.3 mmol/L — ABNORMAL HIGH (ref 3.5–5.1)
Potassium: 4.2 mmol/L (ref 3.5–5.1)
Sodium: 132 mmol/L — ABNORMAL LOW (ref 135–145)
Sodium: 135 mmol/L (ref 135–145)

## 2019-02-05 LAB — CBG MONITORING, ED
Glucose-Capillary: 391 mg/dL — ABNORMAL HIGH (ref 70–99)
Glucose-Capillary: 325 mg/dL — ABNORMAL HIGH (ref 70–99)

## 2019-02-05 LAB — COMPREHENSIVE METABOLIC PANEL
ALT: 14 U/L (ref 0–44)
AST: 15 U/L (ref 15–41)
Albumin: 3.6 g/dL (ref 3.5–5.0)
Alkaline Phosphatase: 100 U/L (ref 38–126)
Anion gap: >20 — ABNORMAL HIGH (ref 5–15)
BUN: 75 mg/dL — ABNORMAL HIGH (ref 6–20)
CO2: 19 mmol/L — ABNORMAL LOW (ref 22–32)
Calcium: 8.2 mg/dL — ABNORMAL LOW (ref 8.9–10.3)
Chloride: 88 mmol/L — ABNORMAL LOW (ref 98–111)
Creatinine, Ser: 11.9 mg/dL — ABNORMAL HIGH (ref 0.61–1.24)
GFR calc Af Amer: 5 mL/min — ABNORMAL LOW (ref >60)
GFR calc non Af Amer: 4 mL/min — ABNORMAL LOW (ref >60)
Glucose, Bld: 399 mg/dL — ABNORMAL HIGH (ref 70–99)
Potassium: 5.3 mmol/L — ABNORMAL HIGH (ref 3.5–5.1)
Sodium: 128 mmol/L — ABNORMAL LOW (ref 135–145)
Total Bilirubin: 0.5 mg/dL (ref 0.3–1.2)
Total Protein: 8 g/dL (ref 6.5–8.1)

## 2019-02-05 LAB — GLUCOSE, CAPILLARY
Glucose-Capillary: 198 mg/dL — ABNORMAL HIGH (ref 70–99)
Glucose-Capillary: 267 mg/dL — ABNORMAL HIGH (ref 70–99)
Glucose-Capillary: 305 mg/dL — ABNORMAL HIGH (ref 70–99)
Glucose-Capillary: 318 mg/dL — ABNORMAL HIGH (ref 70–99)

## 2019-02-05 LAB — MAGNESIUM: Magnesium: 2.6 mg/dL — ABNORMAL HIGH (ref 1.7–2.4)

## 2019-02-05 LAB — LACTIC ACID, PLASMA: Lactic Acid, Venous: 1.4 mmol/L (ref 0.5–1.9)

## 2019-02-05 LAB — MRSA PCR SCREENING: MRSA by PCR: NEGATIVE

## 2019-02-05 LAB — BETA-HYDROXYBUTYRIC ACID: Beta-Hydroxybutyric Acid: 0.93 mmol/L — ABNORMAL HIGH (ref 0.05–0.27)

## 2019-02-05 LAB — SARS CORONAVIRUS 2 BY RT PCR (HOSPITAL ORDER, PERFORMED IN ~~LOC~~ HOSPITAL LAB): SARS Coronavirus 2: NEGATIVE

## 2019-02-05 MED ORDER — ATORVASTATIN CALCIUM 40 MG PO TABS
80.0000 mg | ORAL_TABLET | Freq: Every evening | ORAL | Status: DC
  Administered 2019-02-05: 80 mg via ORAL
  Filled 2019-02-05: qty 2

## 2019-02-05 MED ORDER — ENOXAPARIN SODIUM 40 MG/0.4ML ~~LOC~~ SOLN
40.0000 mg | SUBCUTANEOUS | Status: DC
  Filled 2019-02-05: qty 0.4

## 2019-02-05 MED ORDER — RABIES VACCINE, PCEC IM SUSR: 1.0000 mL | Freq: Once | INTRAMUSCULAR | Status: DC

## 2019-02-05 MED ORDER — ASPIRIN EC 81 MG PO TBEC
81.0000 mg | DELAYED_RELEASE_TABLET | Freq: Every day | ORAL | Status: DC
  Administered 2019-02-06 – 2019-02-08 (×3): 81 mg via ORAL
  Filled 2019-02-05 (×3): qty 1

## 2019-02-05 MED ORDER — DEXTROSE-NACL 5-0.45 % IV SOLN
INTRAVENOUS | Status: DC
  Administered 2019-02-05: via INTRAVENOUS

## 2019-02-05 MED ORDER — SODIUM CHLORIDE 0.9 % IV SOLN
INTRAVENOUS | Status: DC
  Administered 2019-02-05: 21:00:00 via INTRAVENOUS

## 2019-02-05 MED ORDER — RABIES IMMUNE GLOBULIN 150 UNIT/ML IM INJ
20.0000 [IU]/kg | INJECTION | Freq: Once | INTRAMUSCULAR | Status: DC
  Filled 2019-02-05: qty 18

## 2019-02-05 MED ORDER — RABIES IMMUNE GLOBULIN 150 UNIT/ML IM INJ
20.0000 [IU]/kg | INJECTION | Freq: Once | INTRAMUSCULAR | Status: DC
  Filled 2019-02-05: qty 16.5

## 2019-02-05 MED ORDER — PANTOPRAZOLE SODIUM 40 MG PO TBEC
40.0000 mg | DELAYED_RELEASE_TABLET | Freq: Every day | ORAL | Status: DC
  Administered 2019-02-05: 22:00:00 40 mg via ORAL
  Filled 2019-02-05: qty 1

## 2019-02-05 MED ORDER — SODIUM CHLORIDE 0.9 % IV SOLN: 1000.0000 mL | INTRAVENOUS | Status: DC

## 2019-02-05 MED ORDER — SODIUM CHLORIDE 0.9 % IV BOLUS
1000.0000 mL | Freq: Once | INTRAVENOUS | Status: AC
  Administered 2019-02-05: 1000 mL via INTRAVENOUS

## 2019-02-05 MED ORDER — ALBUTEROL SULFATE (2.5 MG/3ML) 0.083% IN NEBU: 3.0000 mL | INHALATION_SOLUTION | Freq: Four times a day (QID) | RESPIRATORY_TRACT | Status: DC | PRN

## 2019-02-05 MED ORDER — SODIUM CHLORIDE 0.9 % IV BOLUS (SEPSIS)
1000.0000 mL | Freq: Once | INTRAVENOUS | Status: AC
  Administered 2019-02-05: 1000 mL via INTRAVENOUS

## 2019-02-05 MED ORDER — SODIUM CHLORIDE 0.9 % IV SOLN: INTRAVENOUS | Status: AC

## 2019-02-05 MED ORDER — FAMOTIDINE 20 MG PO TABS
20.0000 mg | ORAL_TABLET | Freq: Two times a day (BID) | ORAL | Status: DC
  Administered 2019-02-05: 22:00:00 20 mg via ORAL
  Filled 2019-02-05: qty 1

## 2019-02-05 MED ORDER — INSULIN REGULAR(HUMAN) IN NACL 100-0.9 UT/100ML-% IV SOLN
INTRAVENOUS | Status: DC
  Administered 2019-02-05: 2.6 [IU]/h via INTRAVENOUS
  Filled 2019-02-05 (×2): qty 100

## 2019-02-05 MED ORDER — CLOPIDOGREL BISULFATE 75 MG PO TABS
75.0000 mg | ORAL_TABLET | Freq: Every day | ORAL | Status: DC
  Administered 2019-02-06 – 2019-02-08 (×3): 75 mg via ORAL
  Filled 2019-02-05 (×3): qty 1

## 2019-02-05 MED ORDER — RABIES VACCINE, PCEC IM SUSR
INTRAMUSCULAR | Status: AC
  Filled 2019-02-05: qty 1
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
## 2019-02-05 NOTE — ED Notes (Signed)
Pt states he also hasn't been able to taste. This has been going on for a week as well. Took a COVID test and it was negative.

## 2019-02-05 NOTE — ED Triage Notes (Signed)
Last week pt got bitten by a kitten and states since then he has been feeling weird. Wife states he should be seen for diabetes, but he denies this. Pt has been vomiting as well. Threw up once yesterday.
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
## 2019-02-05 NOTE — ED Provider Notes (Signed)
CARE CONTINUED FROM Brandon Melnick, PA-C AT [facility].  Patient is a 58 year old male who presents to the emergency department because of an animal bite.  The patient received a kitten a few days ago.  The patient sustained a bite to the left finger on or about Sunday, May 17.  Patient states that he felt fine until on Tuesday, May 19.  He just generally did not feel well.  He says that he did not eat may 20, 21, nor 22.  He did get some liquids down.  He says when he attempted to eat at times he vomited.  He reports only 2 episodes of vomiting.  He is an insulin requiring diabetic, but did not take his insulin this morning.  He says that his blood sugar checks on May 20 and 21 were high (greater than 600).  The patient spoke with his primary physician and was instructed to increase his insulin.  The patient denies any recent fever or chills.  Is been no red streaks from his hand.  No drainage or pus from his hand.  Patient states he has had some wheezing, but says he has chronic wheezing, as he is a smoker.  He is not had much of a cough.  The patient states that he is not been around anyone that he is aware of this been sick.  He has not been around anyone diagnosed with COVID-19 virus.  While at his primary physician he had a COVID test and he was found to be negative.  The patient was awaiting results of his lab tests. The complete blood count returns within normal limits.  There is no shift to the left. The comprehensive metabolic panel shows the sodium to be low at 128, the potassium high at 5.3, the chloride low at 88, the CO2 low at 19, the glucose elevated at 399, the BUN significantly elevated at 75, and the creatinine elevated at 11.9.  I have reviewed the patient's previous labs and he had a similar episode several years ago with elevation in his renal function, but not this high.  The patient is also had episodes of uncontrolled hyperglycemia.  The patient was ordered and a liter of fluid by Ms.  Sophia.  I have ordered another liter, as the patient's blood pressure is fluctuating between 76 and 90 systolic.  The patient is awake and alert and in no distress.  His pulse rate is 82 by my count.  His respiratory rate is 21 by my count.  His lungs are clear.  His airway is patent.  His heart is beating had a good regular rate and rhythm, without rub, gallop, or acute abnormality and rhythm.  Abdomen is soft with good bowel sounds.  There is no mass or pulsating mass noted.  There is no CVA tenderness appreciated.  There is no edema of the extremities.  The capillary refill is less than 2 seconds bilaterally.  The chest x-ray returned showing no edema or consolidation.  The anion gap is elevated at greater than 20.  I have discussed the findings with the patient in terms of which he understands.  I discussed with him my desire for him to be admitted to the hospital for additional evaluation and management of these multiple issues.  The patient is in agreement.  Pt seen with me by Dr Lacinda Axon.  Case to be discussed with triad hospitalist.  Pt to be admitted with DKA, and Acute Renal Injury   Lily Kocher, PA-C 02/06/19  [place], MD 02/10/19 [phone]

## 2019-02-06 ENCOUNTER — Inpatient Hospital Stay (HOSPITAL_COMMUNITY): Payer: Medicaid Other

## 2019-02-06 DIAGNOSIS — N179 Acute kidney failure, unspecified: Principal | ICD-10-CM

## 2019-02-06 LAB — URINALYSIS, ROUTINE W REFLEX MICROSCOPIC
Glucose, UA: 50 mg/dL — AB
Hgb urine dipstick: NEGATIVE
Ketones, ur: NEGATIVE mg/dL
Leukocytes,Ua: NEGATIVE
Nitrite: NEGATIVE
Protein, ur: 30 mg/dL — AB
Specific Gravity, Urine: 1.021 (ref 1.005–1.030)
pH: 5 (ref 5.0–8.0)

## 2019-02-06 LAB — GLUCOSE, CAPILLARY
Glucose-Capillary: 179 mg/dL — ABNORMAL HIGH (ref 70–99)
Glucose-Capillary: 130 mg/dL — ABNORMAL HIGH (ref 70–99)
Glucose-Capillary: 119 mg/dL — ABNORMAL HIGH (ref 70–99)
Glucose-Capillary: 136 mg/dL — ABNORMAL HIGH (ref 70–99)
Glucose-Capillary: 158 mg/dL — ABNORMAL HIGH (ref 70–99)
Glucose-Capillary: 175 mg/dL — ABNORMAL HIGH (ref 70–99)
Glucose-Capillary: 219 mg/dL — ABNORMAL HIGH (ref 70–99)
Glucose-Capillary: 243 mg/dL — ABNORMAL HIGH (ref 70–99)
Glucose-Capillary: 232 mg/dL — ABNORMAL HIGH (ref 70–99)
Glucose-Capillary: 240 mg/dL — ABNORMAL HIGH (ref 70–99)
Glucose-Capillary: 223 mg/dL — ABNORMAL HIGH (ref 70–99)
Glucose-Capillary: 252 mg/dL — ABNORMAL HIGH (ref 70–99)
Glucose-Capillary: 214 mg/dL — ABNORMAL HIGH (ref 70–99)
Glucose-Capillary: 286 mg/dL — ABNORMAL HIGH (ref 70–99)

## 2019-02-06 LAB — BASIC METABOLIC PANEL
Anion gap: 20 — ABNORMAL HIGH (ref 5–15)
Anion gap: 19 — ABNORMAL HIGH (ref 5–15)
Anion gap: 16 — ABNORMAL HIGH (ref 5–15)
BUN: 79 mg/dL — ABNORMAL HIGH (ref 6–20)
BUN: 81 mg/dL — ABNORMAL HIGH (ref 6–20)
BUN: 76 mg/dL — ABNORMAL HIGH (ref 6–20)
CO2: 18 mmol/L — ABNORMAL LOW (ref 22–32)
CO2: 15 mmol/L — ABNORMAL LOW (ref 22–32)
CO2: 17 mmol/L — ABNORMAL LOW (ref 22–32)
Calcium: 7.3 mg/dL — ABNORMAL LOW (ref 8.9–10.3)
Calcium: 7.2 mg/dL — ABNORMAL LOW (ref 8.9–10.3)
Calcium: 7.1 mg/dL — ABNORMAL LOW (ref 8.9–10.3)
Chloride: 97 mmol/L — ABNORMAL LOW (ref 98–111)
Chloride: 99 mmol/L (ref 98–111)
Chloride: 100 mmol/L (ref 98–111)
Creatinine, Ser: 11.86 mg/dL — ABNORMAL HIGH (ref 0.61–1.24)
Creatinine, Ser: 10.45 mg/dL — ABNORMAL HIGH (ref 0.61–1.24)
Creatinine, Ser: 9.14 mg/dL — ABNORMAL HIGH (ref 0.61–1.24)
GFR calc Af Amer: 5 mL/min — ABNORMAL LOW (ref >60)
GFR calc Af Amer: 6 mL/min — ABNORMAL LOW (ref >60)
GFR calc Af Amer: 7 mL/min — ABNORMAL LOW (ref >60)
GFR calc non Af Amer: 4 mL/min — ABNORMAL LOW (ref >60)
GFR calc non Af Amer: 5 mL/min — ABNORMAL LOW (ref >60)
GFR calc non Af Amer: 6 mL/min — ABNORMAL LOW (ref >60)
Glucose, Bld: 118 mg/dL — ABNORMAL HIGH (ref 70–99)
Glucose, Bld: 204 mg/dL — ABNORMAL HIGH (ref 70–99)
Glucose, Bld: 262 mg/dL — ABNORMAL HIGH (ref 70–99)
Potassium: 3.7 mmol/L (ref 3.5–5.1)
Potassium: 4 mmol/L (ref 3.5–5.1)
Potassium: 3.8 mmol/L (ref 3.5–5.1)
Sodium: 135 mmol/L (ref 135–145)
Sodium: 133 mmol/L — ABNORMAL LOW (ref 135–145)
Sodium: 133 mmol/L — ABNORMAL LOW (ref 135–145)

## 2019-02-06 LAB — CK TOTAL AND CKMB (NOT AT ARMC)
CK, MB: 2.4 ng/mL (ref 0.5–5.0)
Relative Index: 1 (ref 0.0–2.5)
Total CK: 236 U/L (ref 49–397)

## 2019-02-06 LAB — HEMOGLOBIN A1C
Hgb A1c MFr Bld: 14.9 % — ABNORMAL HIGH (ref 4.8–5.6)
Mean Plasma Glucose: 380.93 mg/dL

## 2019-02-06 LAB — CORTISOL: Cortisol, Plasma: 12.5 ug/dL

## 2019-02-06 LAB — SODIUM, URINE, RANDOM: Sodium, Ur: 43 mmol/L

## 2019-02-06 LAB — TROPONIN I: Troponin I: <0.03 ng/mL (ref <0.03)

## 2019-02-06 MED ORDER — ALUM & MAG HYDROXIDE-SIMETH 200-200-20 MG/5ML PO SUSP
30.0000 mL | ORAL | Status: DC | PRN
  Administered 2019-02-06 – 2019-02-08 (×3): 30 mL via ORAL
  Filled 2019-02-06 (×3): qty 30

## 2019-02-06 MED ORDER — SODIUM BICARBONATE 8.4 % IV SOLN
INTRAVENOUS | Status: AC
  Filled 2019-02-06: qty 300

## 2019-02-06 MED ORDER — INSULIN ASPART 100 UNIT/ML ~~LOC~~ SOLN
0.0000 [IU] | Freq: Every day | SUBCUTANEOUS | Status: DC
  Administered 2019-02-06 – 2019-02-07 (×2): 3 [IU] via SUBCUTANEOUS

## 2019-02-06 MED ORDER — SODIUM CHLORIDE 0.9 % IV BOLUS
1000.0000 mL | Freq: Once | INTRAVENOUS | Status: AC
  Administered 2019-02-06: 1000 mL via INTRAVENOUS

## 2019-02-06 MED ORDER — SODIUM CHLORIDE 0.9 % IV SOLN
INTRAVENOUS | Status: DC
  Administered 2019-02-06: 06:00:00 via INTRAVENOUS

## 2019-02-06 MED ORDER — HEPARIN SODIUM (PORCINE) 5000 UNIT/ML IJ SOLN
5000.0000 [IU] | Freq: Three times a day (TID) | INTRAMUSCULAR | Status: DC
  Administered 2019-02-06 – 2019-02-08 (×8): 5000 [IU] via SUBCUTANEOUS
  Filled 2019-02-06 (×7): qty 1

## 2019-02-06 MED ORDER — INSULIN DETEMIR 100 UNIT/ML ~~LOC~~ SOLN
20.0000 [IU] | Freq: Two times a day (BID) | SUBCUTANEOUS | Status: DC
  Administered 2019-02-06 – 2019-02-07 (×3): 20 [IU] via SUBCUTANEOUS
  Filled 2019-02-06 (×6): qty 0.2

## 2019-02-06 MED ORDER — ONDANSETRON HCL 4 MG/2ML IJ SOLN
4.0000 mg | Freq: Four times a day (QID) | INTRAMUSCULAR | Status: DC | PRN
  Administered 2019-02-06: 4 mg via INTRAVENOUS
  Filled 2019-02-06: qty 2

## 2019-02-06 MED ORDER — FAMOTIDINE 20 MG PO TABS
10.0000 mg | ORAL_TABLET | Freq: Every day | ORAL | Status: DC
  Administered 2019-02-06 – 2019-02-08 (×3): 10 mg via ORAL
  Filled 2019-02-06 (×3): qty 1

## 2019-02-06 MED ORDER — ACETAMINOPHEN 325 MG PO TABS
650.0000 mg | ORAL_TABLET | Freq: Four times a day (QID) | ORAL | Status: DC | PRN
  Administered 2019-02-06 – 2019-02-08 (×4): 650 mg via ORAL
  Filled 2019-02-06 (×4): qty 2

## 2019-02-06 MED ORDER — STERILE WATER FOR INJECTION IV SOLN
INTRAVENOUS | Status: DC
  Administered 2019-02-06 – 2019-02-08 (×4): via INTRAVENOUS
  Filled 2019-02-06 (×10): qty 850

## 2019-02-06 MED ORDER — INSULIN ASPART 100 UNIT/ML ~~LOC~~ SOLN
0.0000 [IU] | Freq: Three times a day (TID) | SUBCUTANEOUS | Status: DC
  Administered 2019-02-06 – 2019-02-07 (×2): 5 [IU] via SUBCUTANEOUS
  Administered 2019-02-07: 11 [IU] via SUBCUTANEOUS
  Administered 2019-02-07: 3 [IU] via SUBCUTANEOUS
  Administered 2019-02-08: 2 [IU] via SUBCUTANEOUS

## 2019-02-06 MED ORDER — INSULIN DETEMIR 100 UNIT/ML ~~LOC~~ SOLN
10.0000 [IU] | Freq: Two times a day (BID) | SUBCUTANEOUS | Status: DC
  Administered 2019-02-06: 10 [IU] via SUBCUTANEOUS
  Filled 2019-02-06 (×5): qty 0.1
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
## 2019-02-06 NOTE — Progress Notes (Signed)
Notified Hospitalist due to no urine output and no urge to urinate. Also for Lab values that have resulted, Orders received and completed.

## 2019-02-06 NOTE — Progress Notes (Signed)
[facility]  Cc: ARF, not making urine  Per RN, pt is not making urine.  Pt is currently on D5 ns at 31mL per hour Pt received about 3L ? 4L Ns prior to change to D5ns Bladder scan per RN negative  Exam:  T 97.9  P 74  rr 22  Bp 86/49  Pox 97% Wt 122.5 kg  Heent: anicteric Neck: no jvd Heart: rrr s1, s2,  Lung: ctab Abd: soft, nt,  Nd, +bs Ext: no c/c/e  BUN 83, Creatinine 12.22  A/P Acute renal failure with anuria, ? ATN, dehydration r/o obstruction Urinalysis, Urine sodium already  in computer Check urine creatinine Check urine eosinophils Check renal ultrasound Ns 1L iv x1 Consider foley Nephrology consult placed in computer  Hypotension Check trop I , CPK, MB 12 lead ekg this AM, please follow up Ns 1L iv x1  Gerd DC Protonix Lower dose of Pepcid from 20mg  po bid to 10mg  po qday  DVT prophylaxis, STOP Lovenox (pt didn't receive), use heparin 5000 [place] tid.   Pt status critical due to  ARF, creatinine 12.22 , now with anuria Critical care time 30 minutes

## 2019-02-06 NOTE — Progress Notes (Signed)
PROGRESS NOTE    Gregory Smith  ULA:[id number] DOB: June 03, 1961 DOA: 02/05/2019 PCP: Gregory Gravel, MD   Brief Narrative:  Per HPI: Gregory Smith is a 58 y.o. male with a history of type 2 diabetes, hypertension, history of MI, GERD, hyperlipidemia.  Patient seen for cat bite on his left index finger which happened earlier this week.  No redness or swelling, but presented due to not feeling well with vomiting and nausea.  The patient states that the cat did not look well and is at the bedside being tested.  Patient has had vomiting over the past 3 days which is mainly stomach contents.  Eating and drinking exacerbate his vomiting.  No other palliating or provoking factors.  Patient has been taking his medications, including his metformin and his lisinopril.  He has been urinating, although less frequently.  He is noted to be significantly oliguric with acute renal failure and further studies have been ordered overnight.  Appreciate nephrology evaluation.  Assessment & Plan:   Principal Problem:   ARF (acute renal failure) (HCC) Active Problems:   Essential hypertension   Uncontrolled type 2 diabetes mellitus ([place])   Mixed hyperlipidemia   Uncontrolled type II diabetes mellitus ([place])   DKA (diabetic ketoacidoses) (HCC)   Cat bite  DKA -Continue D5 IV fluid now with bicarb added per nephrology along with insulin drip until anion gap closes at which point diet and SSI with long-acting insulin will be ordered -Follow-up repeat BMP  AKI -Appears to be prerenal in nature with use of metformin and lisinopril at home along with dehydration related to above -Appreciate nephrology work-up with CPK and renal ultrasound pending -2D echocardiogram as well as cortisol ordered for hypotension -Bicarb added for acidosis  Type 2 diabetes -Oral medications currently withheld and patient on insulin drip for DKA management -We will check hemoglobin A1c and transition SSI  Hypertension -Hold blood  pressure medications due to soft blood pressure readings currently  Cat bite -Does not appear to require any antibiotic prophylaxis -Received rabies shots  Dyslipidemia -Hold statin for now and await CPK levels  DVT prophylaxis: Heparin Code Status: Full code Family Communication: None at bedside Disposition Plan: Nephrology evaluation and resolution of DKA pending.   Consultants:   Nephrology  Procedures:   None  Antimicrobials:   None   Subjective: Patient seen and evaluated today with no new acute complaints or concerns. No acute concerns or events noted overnight.  He states that his mouth is dry but denies any abdominal pain, nausea, or vomiting.  He denies any pain or swelling to his left index finger from cat bite site.  Objective: Vitals:   02/06/19 0300 02/06/19 0400 02/06/19 0500 02/06/19 0600  BP: (!) 82/52 (!) 76/48 (!) 77/47 98/61  Pulse: 78 73 74 79  Resp: 20 16 14 14   Temp:      TempSrc:      SpO2: 93% 93% 93% 95%  Weight:      Height:        Intake/Output Summary (Last 24 hours) at 02/06/2019 0655 Last data filed at 02/06/2019 2122 Gross per 24 hour  Intake 2399.22 ml  Output 200 ml  Net 2199.22 ml   Filed Weights   02/05/19 1428  Weight: 122.5 kg    Examination:  General exam: Appears calm and comfortable  Respiratory system: Clear to auscultation. Respiratory effort normal. Cardiovascular system: S1 & S2 heard, RRR. No JVD, murmurs, rubs, gallops or clicks. No pedal edema. Gastrointestinal system: Abdomen  is nondistended, soft and nontender. No organomegaly or masses felt. Normal bowel sounds heard. Central nervous system: Alert and oriented. No focal neurological deficits. Extremities: Symmetric 5 x 5 power. Skin: No rashes, lesions or ulcers Psychiatry: Judgement and insight appear normal. Mood & affect appropriate.     Data Reviewed: I have personally reviewed following labs and imaging studies  CBC: Recent Labs  Lab  02/05/19 1542  WBC 7.2  NEUTROABS 4.6  HGB 13.7  HCT 42.6  MCV 86.8  PLT 295   Basic Metabolic Panel: Recent Labs  Lab 02/05/19 1542 02/05/19 1726 02/05/19 1921 02/05/19 2243 02/06/19 0255  NA 128*  --  132* 135 135  K 5.3*  --  5.3* 4.2 3.7  CL 88*  --  93* 95* 97*  CO2 19*  --  20* 19* 18*  GLUCOSE 399*  --  367* 258* 118*  BUN 75*  --  81* 83* 79*  CREATININE 11.90*  --  12.04* 12.22* 11.86*  CALCIUM 8.2*  --  7.9* 7.7* 7.3*  MG  --  2.6*  --   --   --    GFR: Estimated Creatinine Clearance: 9.5 mL/min (A) (by C-G formula based on SCr of 11.86 mg/dL (H)). Liver Function Tests: Recent Labs  Lab 02/05/19 1542  AST 15  ALT 14  ALKPHOS 100  BILITOT 0.5  PROT 8.0  ALBUMIN 3.6   No results for input(s): LIPASE, AMYLASE in the last 168 hours. No results for input(s): AMMONIA in the last 168 hours. Coagulation Profile: No results for input(s): INR, PROTIME in the last 168 hours. Cardiac Enzymes: Recent Labs  Lab 02/06/19 0255  TROPONINI <0.03   BNP (last 3 results) No results for input(s): PROBNP in the last 8760 hours. HbA1C: No results for input(s): HGBA1C in the last 72 hours. CBG: Recent Labs  Lab 02/06/19 0213 02/06/19 0258 02/06/19 0417 02/06/19 0517 02/06/19 0642  GLUCAP 130* 119* 136* 158* 175*   Lipid Profile: No results for input(s): CHOL, HDL, LDLCALC, TRIG, CHOLHDL, LDLDIRECT in the last 72 hours. Thyroid Function Tests: No results for input(s): TSH, T4TOTAL, FREET4, T3FREE, THYROIDAB in the last 72 hours. Anemia Panel: No results for input(s): VITAMINB12, FOLATE, FERRITIN, TIBC, IRON, RETICCTPCT in the last 72 hours. Sepsis Labs: Recent Labs  Lab 02/05/19 1726  LATICACIDVEN 1.4    Recent Results (from the past 240 hour(s))  SARS Coronavirus 2 (CEPHEID- Performed in [facility] hospital lab), Hosp Order     Status: None   Collection Time: 02/05/19  5:26 PM  Result Value Ref Range Status   SARS Coronavirus 2 NEGATIVE NEGATIVE  Final    Comment: (NOTE) If result is NEGATIVE SARS-CoV-2 target nucleic acids are NOT DETECTED. The SARS-CoV-2 RNA is generally detectable in upper and lower  respiratory specimens during the acute phase of infection. The lowest  concentration of SARS-CoV-2 viral copies this assay can detect is 250  copies / mL. A negative result does not preclude SARS-CoV-2 infection  and should not be used as the sole basis for treatment or other  patient management decisions.  A negative result may occur with  improper specimen collection / handling, submission of specimen other  than nasopharyngeal swab, presence of viral mutation(s) within the  areas targeted by this assay, and inadequate number of viral copies  (<250 copies / mL). A negative result must be combined with clinical  observations, patient history, and epidemiological information. If result is POSITIVE SARS-CoV-2 target nucleic acids are DETECTED.  The SARS-CoV-2 RNA is generally detectable in upper and lower  respiratory specimens dur ing the acute phase of infection.  Positive  results are indicative of active infection with SARS-CoV-2.  Clinical  correlation with patient history and other diagnostic information is  necessary to determine patient infection status.  Positive results do  not rule out bacterial infection or co-infection with other viruses. If result is PRESUMPTIVE POSTIVE SARS-CoV-2 nucleic acids MAY BE PRESENT.   A presumptive positive result was obtained on the submitted specimen  and confirmed on repeat testing.  While 2019 novel coronavirus  (SARS-CoV-2) nucleic acids may be present in the submitted sample  additional confirmatory testing may be necessary for epidemiological  and / or clinical management purposes  to differentiate between  SARS-CoV-2 and other Sarbecovirus currently known to infect humans.  If clinically indicated additional testing with an alternate test  methodology [phone]) is advised. The  SARS-CoV-2 RNA is generally  detectable in upper and lower respiratory sp ecimens during the acute  phase of infection. The expected result is Negative. Fact Sheet for Patients:  StrictlyIdeas.no Fact Sheet for Healthcare Providers: BankingDealers.co.za This test is not yet approved or cleared by the [place] FDA and has been authorized for detection and/or diagnosis of SARS-CoV-2 by FDA under an Emergency Use Authorization (EUA).  This EUA will remain in effect (meaning this test can be used) for the duration of the COVID-19 declaration under Section 564(b)(1) of the Act, 21 U.S.C. section 360bbb-3(b)(1), unless the authorization is terminated or revoked sooner. Performed at [facility], [address], [place], [place] [address]   MRSA PCR Screening     Status: None   Collection Time: 02/05/19  8:09 PM  Result Value Ref Range Status   MRSA by PCR NEGATIVE NEGATIVE Final    Comment:        The GeneXpert MRSA Assay (FDA approved for NASAL specimens only), is one component of a comprehensive MRSA colonization surveillance program. It is not intended to diagnose MRSA infection nor to guide or monitor treatment for MRSA infections. Performed at [facility], [address] [place], [place] [address]          Radiology Studies: Dg Chest Portable 1 View  Result Date: 02/05/2019 CLINICAL DATA:  Vomiting and loss of sense of taste. Recent cat bite EXAM: PORTABLE CHEST 1 VIEW COMPARISON:  July 28, 2018 FINDINGS: There is no appreciable edema or consolidation. Heart size and pulmonary vascularity are normal. No adenopathy. Evidence of old rib trauma on the left is stable. IMPRESSION: No edema or consolidation. Electronically Signed   By: Lowella Grip III M.D.   On: 02/05/2019 17:31        Scheduled Meds: . aspirin EC  81 mg Oral Q breakfast  . atorvastatin  80 mg Oral QPM  . clopidogrel  75 mg Oral Daily  .  famotidine  10 mg Oral Daily  . heparin injection (subcutaneous)  5,000 Units Subcutaneous Q8H   Continuous Infusions: . sodium chloride    . sodium chloride 125 mL/hr at 02/05/19 2351  . sodium chloride 100 mL/hr at 02/06/19 7001  . dextrose 5 % and 0.45% NaCl Stopped (02/06/19 0506)  . insulin Stopped (02/05/19 2102)     LOS: 1 day    Time spent: 30 minutes    Sirenity Shew Darleen Crocker, DO [facility] Pager [phone]  If 7PM-7AM, please contact night-coverage [email or web address] Password [facility] 02/06/2019, 6:55 AM

## 2019-02-06 NOTE — Progress Notes (Signed)
Inpatient Diabetes Program Recommendations  AACE/ADA: New Consensus Statement on Inpatient Glycemic Control (2015)  Target Ranges:  Prepandial:   less than 140 mg/dL      Peak postprandial:   less than 180 mg/dL (1-2 hours)      Critically ill patients:  140 - 180 mg/dL   Lab Results  Component Value Date   GLUCAP 252 (H) 02/06/2019   HGBA1C 11.0 (H) 07/19/2018    Review of Glycemic Control Results for Gregory, Smith (MRN [id number]) as of 02/06/2019 14:31  Ref. Range 02/06/2019 11:13  Sodium Latest Ref Range: 135 - 145 mmol/L 133 (L)  Potassium Latest Ref Range: 3.5 - 5.1 mmol/L 3.8  Chloride Latest Ref Range: 98 - 111 mmol/L 100  CO2 Latest Ref Range: 22 - 32 mmol/L 17 (L)  Glucose Latest Ref Range: 70 - 99 mg/dL 262 (H)  BUN Latest Ref Range: 6 - 20 mg/dL 76 (H)  Creatinine Latest Ref Range: 0.61 - 1.24 mg/dL 9.14 (H)  Calcium Latest Ref Range: 8.9 - 10.3 mg/dL 7.1 (L)  Anion gap Latest Ref Range: 5 - 15  16 (H)  GFR, Est Non African American Latest Ref Range: >60 mL/min 6 (L)  GFR, Est African American Latest Ref Range: >60 mL/min 7 (L)   Diabetes history: DM 2 Outpatient Diabetes medications: Levemir 35 units tid, Novolog 25 units tid with meals if glucose >110, Metformin 1000 mg bid Current orders for Inpatient glycemic control: Levemir increased to 20 units bid, Novolog 0-15 units tid, and Novolog 0-5 units qhs   Inpatient Diabetes Program Recommendations:    IV insulin gtt rate increased at 0800 to up to 9-13 units/hour. Spoke with Dr. Manuella Ghazi about plan of care. Adjusting insulin. Will watch patient trends.  Thanks,  Tama Headings RN, MSN, BC-ADM Inpatient Diabetes Coordinator Team Pager [phone] (8a-5p)

## 2019-02-07 ENCOUNTER — Inpatient Hospital Stay (HOSPITAL_COMMUNITY): Payer: Medicaid Other

## 2019-02-07 DIAGNOSIS — I517 Cardiomegaly: Secondary | ICD-10-CM

## 2019-02-07 LAB — MAGNESIUM: Magnesium: 2.2 mg/dL (ref 1.7–2.4)

## 2019-02-07 LAB — HIV ANTIBODY (ROUTINE TESTING W REFLEX): HIV Screen 4th Generation wRfx: NONREACTIVE

## 2019-02-07 LAB — GLUCOSE, CAPILLARY
Glucose-Capillary: 196 mg/dL — ABNORMAL HIGH (ref 70–99)
Glucose-Capillary: 214 mg/dL — ABNORMAL HIGH (ref 70–99)
Glucose-Capillary: 288 mg/dL — ABNORMAL HIGH (ref 70–99)
Glucose-Capillary: 306 mg/dL — ABNORMAL HIGH (ref 70–99)
Glucose-Capillary: 345 mg/dL — ABNORMAL HIGH (ref 70–99)

## 2019-02-07 LAB — CBC
HCT: 34.7 % — ABNORMAL LOW (ref 39.0–52.0)
Hemoglobin: 11.2 g/dL — ABNORMAL LOW (ref 13.0–17.0)
MCH: 27.9 pg (ref 26.0–34.0)
MCHC: 32.3 g/dL (ref 30.0–36.0)
MCV: 86.3 fL (ref 80.0–100.0)
Platelets: 254 10*3/uL (ref 150–400)
RBC: 4.02 MIL/uL — ABNORMAL LOW (ref 4.22–5.81)
RDW: 14.9 % (ref 11.5–15.5)
WBC: 6.2 10*3/uL (ref 4.0–10.5)
nRBC: 0 % (ref 0.0–0.2)

## 2019-02-07 LAB — ECHOCARDIOGRAM COMPLETE
Height: 72 in
Weight: 4186.98 oz

## 2019-02-07 LAB — BASIC METABOLIC PANEL
Anion gap: 13 (ref 5–15)
BUN: 55 mg/dL — ABNORMAL HIGH (ref 6–20)
CO2: 25 mmol/L (ref 22–32)
Calcium: 7.1 mg/dL — ABNORMAL LOW (ref 8.9–10.3)
Chloride: 98 mmol/L (ref 98–111)
Creatinine, Ser: 3.51 mg/dL — ABNORMAL HIGH (ref 0.61–1.24)
GFR calc Af Amer: 21 mL/min — ABNORMAL LOW (ref >60)
GFR calc non Af Amer: 18 mL/min — ABNORMAL LOW (ref >60)
Glucose, Bld: 229 mg/dL — ABNORMAL HIGH (ref 70–99)
Potassium: 3.2 mmol/L — ABNORMAL LOW (ref 3.5–5.1)
Sodium: 136 mmol/L (ref 135–145)

## 2019-02-07 LAB — CALCIUM / CREATININE RATIO, URINE
Calcium, Ur: 1.5 mg/dL
Calcium/Creat.Ratio: 4 mg/g creat — ABNORMAL LOW (ref 14–318)
Creatinine, Urine: 407.4 mg/dL

## 2019-02-07 MED ORDER — ATORVASTATIN CALCIUM 40 MG PO TABS
80.0000 mg | ORAL_TABLET | Freq: Every evening | ORAL | Status: DC
  Administered 2019-02-07: 80 mg via ORAL
  Filled 2019-02-07: qty 2

## 2019-02-07 MED ORDER — POTASSIUM CHLORIDE CRYS ER 20 MEQ PO TBCR
40.0000 meq | EXTENDED_RELEASE_TABLET | Freq: Once | ORAL | Status: AC
  Administered 2019-02-07: 40 meq via ORAL
  Filled 2019-02-07: qty 2

## 2019-02-07 MED ORDER — INSULIN ASPART 100 UNIT/ML ~~LOC~~ SOLN
3.0000 [IU] | Freq: Three times a day (TID) | SUBCUTANEOUS | Status: DC
  Administered 2019-02-07 – 2019-02-08 (×4): 3 [IU] via SUBCUTANEOUS

## 2019-02-07 MED ORDER — INSULIN ASPART 100 UNIT/ML FLEXPEN
3.0000 [IU] | PEN_INJECTOR | Freq: Three times a day (TID) | SUBCUTANEOUS | Status: DC
  Filled 2019-02-07: qty 3

## 2019-02-07 NOTE — Progress Notes (Signed)
PROGRESS NOTE    Gregory Smith  ZOX:[id number] DOB: 05-24-1961 DOA: 02/05/2019 PCP: Jani Gravel, MD   Brief Narrative:  Per HPI: Gregory Smith a 58 y.o.malewith a history of type 2 diabetes, hypertension, history of MI, GERD, hyperlipidemia. Patient seen for cat bite on his left index finger which happened earlier this week. No redness or swelling, but presented due to not feeling well with vomiting and nausea. The patient states that the cat did not look well and is at the bedside being tested. Patient has had vomiting over the past 3 days which is mainly stomach contents. Eating and drinking exacerbate his vomiting. No other palliating or provoking factors. Patient has been taking his medications, including his metformin and his lisinopril. He has been urinating, although less frequently.  He is noted to be significantly oliguric with acute renal failure and further studies have been ordered overnight.  Appreciate nephrology evaluation with noted significant improvement thus far.   Assessment & Plan:   Principal Problem:   ARF (acute renal failure) (HCC) Active Problems:   Essential hypertension   Uncontrolled type 2 diabetes mellitus (HCC)   Mixed hyperlipidemia   Uncontrolled type II diabetes mellitus ([place])   DKA (diabetic ketoacidoses) (HCC)   Cat bite   DKA-resolved -Continue SSI and Levemir as ordered -We will need close follow-up with PCP on discharge  AKI-significantly improved -Appears to be prerenal in nature with use of metformin and lisinopril at home along with dehydration related to above -Appreciate nephrology work-up CPK within normal limits and renal ultrasound with no hydronephrosis -2D echocardiogram as well as cortisol ordered for hypotension -Acidosis resolved  Type 2 diabetes-currently with hyperglycemia -Oral medications currently withheld and patient on insulin drip for DKA management -We will check hemoglobin A1c which is 14.8%. -Maintain  on Levemir 20 units twice daily as prescribed. -Appreciate diabetes coordinator recommendations.  Hypertension -Hold blood pressure medications due to soft blood pressure readings currently -Awaiting 2D echocardiogram  Cat bite -Does not appear to require any antibiotic prophylaxis -Received rabies shots  Dyslipidemia -Restart statin  DVT prophylaxis: Heparin Code Status: Full code Family Communication: None at bedside Disposition Plan: Nephrology signed off at this time.  2D echocardiogram pending.  Anticipate discharge in a.m. if stable.   Consultants:   Nephrology  Procedures:   None  Antimicrobials:   None  Subjective: Patient seen and evaluated today with no new acute complaints or concerns. No acute concerns or events noted overnight.  He has been urinating more frequently.  His blood pressures continue to stay somewhat low.  Objective: Vitals:   02/07/19 0900 02/07/19 1000 02/07/19 1112 02/07/19 1325  BP:  101/71 104/60 (!) 107/59  Pulse: 81 82 88 75  Resp: (!) 23 (!) 21 20 18   Temp:   98.1 F (36.7 C) 98.8 F (37.1 C)  TempSrc:   Oral Oral  SpO2: 100% 98% 99% 100%  Weight:   119.6 kg   Height:        Intake/Output Summary (Last 24 hours) at 02/07/2019 1407 Last data filed at 02/07/2019 1300 Gross per 24 hour  Intake 2113.01 ml  Output 5150 ml  Net -3036.99 ml   Filed Weights   02/05/19 1428 02/07/19 0500 02/07/19 1112  Weight: 122.5 kg 118.7 kg 119.6 kg    Examination:  General exam: Appears calm and comfortable  Respiratory system: Clear to auscultation. Respiratory effort normal. Cardiovascular system: S1 & S2 heard, RRR. No JVD, murmurs, rubs, gallops or clicks. No pedal edema. Gastrointestinal  system: Abdomen is nondistended, soft and nontender. No organomegaly or masses felt. Normal bowel sounds heard. Central nervous system: Alert and oriented. No focal neurological deficits. Extremities: Symmetric 5 x 5 power. Skin: No rashes,  lesions or ulcers Psychiatry: Judgement and insight appear normal. Mood & affect appropriate.     Data Reviewed: I have personally reviewed following labs and imaging studies  CBC: Recent Labs  Lab 02/05/19 1542 02/07/19 0455  WBC 7.2 6.2  NEUTROABS 4.6  --   HGB 13.7 11.2*  HCT 42.6 34.7*  MCV 86.8 86.3  PLT 290 384   Basic Metabolic Panel: Recent Labs  Lab 02/05/19 1726  02/05/19 2243 02/06/19 0255 02/06/19 0727 02/06/19 1113 02/07/19 0455  NA  --    < > 135 135 133* 133* 136  K  --    < > 4.2 3.7 4.0 3.8 3.2*  CL  --    < > 95* 97* 99 100 98  CO2  --    < > 19* 18* 15* 17* 25  GLUCOSE  --    < > 258* 118* 204* 262* 229*  BUN  --    < > 83* 79* 81* 76* 55*  CREATININE  --    < > 12.22* 11.86* 10.45* 9.14* 3.51*  CALCIUM  --    < > 7.7* 7.3* 7.2* 7.1* 7.1*  MG 2.6*  --   --   --   --   --  2.2   < > = values in this interval not displayed.   GFR: Estimated Creatinine Clearance: 31.8 mL/min (A) (by C-G formula based on SCr of 3.51 mg/dL (H)). Liver Function Tests: Recent Labs  Lab 02/05/19 1542  AST 15  ALT 14  ALKPHOS 100  BILITOT 0.5  PROT 8.0  ALBUMIN 3.6   No results for input(s): LIPASE, AMYLASE in the last 168 hours. No results for input(s): AMMONIA in the last 168 hours. Coagulation Profile: No results for input(s): INR, PROTIME in the last 168 hours. Cardiac Enzymes: Recent Labs  Lab 02/06/19 0255  CKTOTAL 236  CKMB 2.4  TROPONINI <0.03   BNP (last 3 results) No results for input(s): PROBNP in the last 8760 hours. HbA1C: Recent Labs    02/05/19 1541  HGBA1C 14.9*   CBG: Recent Labs  Lab 02/06/19 1648 02/06/19 2114 02/07/19 0041 02/07/19 0739 02/07/19 1121  GLUCAP 214* 286* 306* 196* 345*   Lipid Profile: No results for input(s): CHOL, HDL, LDLCALC, TRIG, CHOLHDL, LDLDIRECT in the last 72 hours. Thyroid Function Tests: No results for input(s): TSH, T4TOTAL, FREET4, T3FREE, THYROIDAB in the last 72 hours. Anemia Panel: No  results for input(s): VITAMINB12, FOLATE, FERRITIN, TIBC, IRON, RETICCTPCT in the last 72 hours. Sepsis Labs: Recent Labs  Lab 02/05/19 1726  LATICACIDVEN 1.4    Recent Results (from the past 240 hour(s))  SARS Coronavirus 2 (CEPHEID- Performed in [facility] hospital lab), Hosp Order     Status: None   Collection Time: 02/05/19  5:26 PM  Result Value Ref Range Status   SARS Coronavirus 2 NEGATIVE NEGATIVE Final    Comment: (NOTE) If result is NEGATIVE SARS-CoV-2 target nucleic acids are NOT DETECTED. The SARS-CoV-2 RNA is generally detectable in upper and lower  respiratory specimens during the acute phase of infection. The lowest  concentration of SARS-CoV-2 viral copies this assay can detect is 250  copies / mL. A negative result does not preclude SARS-CoV-2 infection  and should not be used as the sole  basis for treatment or other  patient management decisions.  A negative result may occur with  improper specimen collection / handling, submission of specimen other  than nasopharyngeal swab, presence of viral mutation(s) within the  areas targeted by this assay, and inadequate number of viral copies  (<250 copies / mL). A negative result must be combined with clinical  observations, patient history, and epidemiological information. If result is POSITIVE SARS-CoV-2 target nucleic acids are DETECTED. The SARS-CoV-2 RNA is generally detectable in upper and lower  respiratory specimens dur ing the acute phase of infection.  Positive  results are indicative of active infection with SARS-CoV-2.  Clinical  correlation with patient history and other diagnostic information is  necessary to determine patient infection status.  Positive results do  not rule out bacterial infection or co-infection with other viruses. If result is PRESUMPTIVE POSTIVE SARS-CoV-2 nucleic acids MAY BE PRESENT.   A presumptive positive result was obtained on the submitted specimen  and confirmed on repeat  testing.  While 2019 novel coronavirus  (SARS-CoV-2) nucleic acids may be present in the submitted sample  additional confirmatory testing may be necessary for epidemiological  and / or clinical management purposes  to differentiate between  SARS-CoV-2 and other Sarbecovirus currently known to infect humans.  If clinically indicated additional testing with an alternate test  methodology [phone]) is advised. The SARS-CoV-2 RNA is generally  detectable in upper and lower respiratory sp ecimens during the acute  phase of infection. The expected result is Negative. Fact Sheet for Patients:  StrictlyIdeas.no Fact Sheet for Healthcare Providers: BankingDealers.co.za This test is not yet approved or cleared by the [place] FDA and has been authorized for detection and/or diagnosis of SARS-CoV-2 by FDA under an Emergency Use Authorization (EUA).  This EUA will remain in effect (meaning this test can be used) for the duration of the COVID-19 declaration under Section 564(b)(1) of the Act, 21 U.S.C. section 360bbb-3(b)(1), unless the authorization is terminated or revoked sooner. Performed at [facility], [address], [place], [place] [address]   MRSA PCR Screening     Status: None   Collection Time: 02/05/19  8:09 PM  Result Value Ref Range Status   MRSA by PCR NEGATIVE NEGATIVE Final    Comment:        The GeneXpert MRSA Assay (FDA approved for NASAL specimens only), is one component of a comprehensive MRSA colonization surveillance program. It is not intended to diagnose MRSA infection nor to guide or monitor treatment for MRSA infections. Performed at [facility], [address] [place], [place] [address]   Culture, blood (Routine X 2) w Reflex to ID Panel     Status: None (Preliminary result)   Collection Time: 02/06/19 11:02 AM  Result Value Ref Range Status   Specimen Description BLOOD BLOOD LEFT ARM  Final   Special  Requests   Final    BOTTLES DRAWN AEROBIC AND ANAEROBIC Blood Culture adequate volume   Culture   Final    NO GROWTH < 24 HOURS Performed at [facility], [address] [place],  [address]    Report Status PENDING  Incomplete  Culture, blood (Routine X 2) w Reflex to ID Panel     Status: None (Preliminary result)   Collection Time: 02/06/19 11:17 AM  Result Value Ref Range Status   Specimen Description BLOOD BLOOD LEFT HAND  Final   Special Requests   Final    BOTTLES DRAWN AEROBIC AND ANAEROBIC Blood Culture results  may not be optimal due to an inadequate volume of blood received in culture bottles   Culture   Final    NO GROWTH < 24 HOURS Performed at [facility], [address], [place], [place] [address]    Report Status PENDING  Incomplete         Radiology Studies: US Renal  Result Date: 02/06/2019 CLINICAL DATA:  Acute renal failure. History of diabetes and hypertension. EXAM: RENAL / URINARY TRACT ULTRASOUND COMPLETE COMPARISON:  None. FINDINGS: Right Kidney: Renal measurements: 11.7 x 5.4 x 6.2 cm = volume: 207 mL . Echogenicity within normal limits. No mass or hydronephrosis visualized. Left Kidney: Renal measurements: 11.6 x 6.7 x 6.6 cm = volume: 267 mL. Echogenicity within normal limits. No mass or hydronephrosis visualized. Bladder: Appears normal for degree of bladder distention. IMPRESSION: Normal renal ultrasound.  No hydronephrosis. Electronically Signed   By: Richardean Sale M.D.   On: 02/06/2019 13:32   Dg Chest Portable 1 View  Result Date: 02/05/2019 CLINICAL DATA:  Vomiting and loss of sense of taste. Recent cat bite EXAM: PORTABLE CHEST 1 VIEW COMPARISON:  July 28, 2018 FINDINGS: There is no appreciable edema or consolidation. Heart size and pulmonary vascularity are normal. No adenopathy. Evidence of old rib trauma on the left is stable. IMPRESSION: No edema or consolidation. Electronically Signed   By: Lowella Grip III M.D.   On: 02/05/2019  17:31        Scheduled Meds: . aspirin EC  81 mg Oral Q breakfast  . clopidogrel  75 mg Oral Daily  . famotidine  10 mg Oral Daily  . heparin injection (subcutaneous)  5,000 Units Subcutaneous Q8H  . insulin aspart  0-15 Units Subcutaneous TID WC  . insulin aspart  0-5 Units Subcutaneous QHS  . insulin aspart  3 Units Subcutaneous TID WC  . insulin detemir  20 Units Subcutaneous BID   Continuous Infusions: .  sodium bicarbonate (isotonic) infusion in sterile water 125 mL/hr at 02/07/19 0600     LOS: 2 days    Time spent: 30 minutes    Shay Jhaveri Darleen Crocker, DO [facility] Pager [phone]  If 7PM-7AM, please contact night-coverage [email or web address] Password [email or web address] 02/07/2019, 2:07 PM

## 2019-02-07 NOTE — Progress Notes (Signed)
Pt had requested a snack therefore a random cbg was completed. Results were too high for any consumption at this time. Long acting insulin has been previously administered.

## 2019-02-07 NOTE — Progress Notes (Signed)
[facility] ROUNDING NOTE   Subjective:   This is a 58 year old gentleman with diabetes, hypertension and history of myocardial infarction and coronary artery disease.  He has a history of reflux and hyperlipidemia.  He was admitted 02/05/2019 with presumed bite from rabid animal.  He smokes cigarettes.  He was found to be in acute kidney injury with a creatinine 11.9 mg/dL.   Blood pressure 104/60 pulse 88 temperature 98.1 sodium 136 potassium 3.2 chloride 98 CO2 25 BUN 55 creatinine 3.51 glucose 229 hemoglobin 11.2   plasma cortisol 12.5 urinalysis no red blood cells no white blood cells small protein  Urine output 2.8 L 02/06/2019  Aspirin 81 mg daily Plavix 75 mg daily Pepcid 10 mg daily insulin sliding scale, insulin Levemir 20 units twice daily,  Renal ultrasound unremarkable  Objective:  Vital signs in last 24 hours:  Temp:  [98.1 F (36.7 C)-98.8 F (37.1 C)] 98.1 F (36.7 C) (05/24 1112) Pulse Rate:  [72-88] 88 (05/24 1112) Resp:  [12-24] 20 (05/24 1112) BP: (84-113)/(50-71) 104/60 (05/24 1112) SpO2:  [80 %-100 %] 99 % (05/24 1112) Weight:  [118.7 kg-119.6 kg] 119.6 kg (05/24 1112)  Weight change: -3.771 kg Filed Weights   02/05/19 1428 02/07/19 0500 02/07/19 1112  Weight: 122.5 kg 118.7 kg 119.6 kg    Intake/Output: I/O last 3 completed shifts: In: 4512.2 [P.O.:240; I.V.:2893.9; IV Piggyback:1378.4] Out: 4900 [Urine:4900]   Intake/Output this shift:  No intake/output data recorded. Awake alert and oriented CVS- RRR no murmurs rubs gallops RS- CTA no wheezes or rales ABD- BS present soft non-distended EXT- no edema   Basic Metabolic Panel: Recent Labs  Lab 02/05/19 1726  02/05/19 2243 02/06/19 0255 02/06/19 0727 02/06/19 1113 02/07/19 0455  NA  --    < > 135 135 133* 133* 136  K  --    < > 4.2 3.7 4.0 3.8 3.2*  CL  --    < > 95* 97* 99 100 98  CO2  --    < > 19* 18* 15* 17* 25  GLUCOSE  --    < > 258* 118* 204* 262* 229*  BUN  --     < > 83* 79* 81* 76* 55*  CREATININE  --    < > 12.22* 11.86* 10.45* 9.14* 3.51*  CALCIUM  --    < > 7.7* 7.3* 7.2* 7.1* 7.1*  MG 2.6*  --   --   --   --   --  2.2   < > = values in this interval not displayed.    Liver Function Tests: Recent Labs  Lab 02/05/19 1542  AST 15  ALT 14  ALKPHOS 100  BILITOT 0.5  PROT 8.0  ALBUMIN 3.6   No results for input(s): LIPASE, AMYLASE in the last 168 hours. No results for input(s): AMMONIA in the last 168 hours.  CBC: Recent Labs  Lab 02/05/19 1542 02/07/19 0455  WBC 7.2 6.2  NEUTROABS 4.6  --   HGB 13.7 11.2*  HCT 42.6 34.7*  MCV 86.8 86.3  PLT 290 254    Cardiac Enzymes: Recent Labs  Lab 02/06/19 0255  CKTOTAL 236  CKMB 2.4  TROPONINI <0.03    BNP: Invalid input(s): POCBNP  CBG: Recent Labs  Lab 02/06/19 1648 02/06/19 2114 02/07/19 0041 02/07/19 0739 02/07/19 1121  GLUCAP 214* 286* 306* 196* 345*    Microbiology: Results for orders placed or performed during the hospital encounter of 02/05/19  SARS Coronavirus 2 (CEPHEID-  Performed in [facility] hospital lab), Hosp Order     Status: None   Collection Time: 02/05/19  5:26 PM  Result Value Ref Range Status   SARS Coronavirus 2 NEGATIVE NEGATIVE Final    Comment: (NOTE) If result is NEGATIVE SARS-CoV-2 target nucleic acids are NOT DETECTED. The SARS-CoV-2 RNA is generally detectable in upper and lower  respiratory specimens during the acute phase of infection. The lowest  concentration of SARS-CoV-2 viral copies this assay can detect is 250  copies / mL. A negative result does not preclude SARS-CoV-2 infection  and should not be used as the sole basis for treatment or other  patient management decisions.  A negative result may occur with  improper specimen collection / handling, submission of specimen other  than nasopharyngeal swab, presence of viral mutation(s) within the  areas targeted by this assay, and inadequate number of viral copies  (<250 copies  / mL). A negative result must be combined with clinical  observations, patient history, and epidemiological information. If result is POSITIVE SARS-CoV-2 target nucleic acids are DETECTED. The SARS-CoV-2 RNA is generally detectable in upper and lower  respiratory specimens dur ing the acute phase of infection.  Positive  results are indicative of active infection with SARS-CoV-2.  Clinical  correlation with patient history and other diagnostic information is  necessary to determine patient infection status.  Positive results do  not rule out bacterial infection or co-infection with other viruses. If result is PRESUMPTIVE POSTIVE SARS-CoV-2 nucleic acids MAY BE PRESENT.   A presumptive positive result was obtained on the submitted specimen  and confirmed on repeat testing.  While 2019 novel coronavirus  (SARS-CoV-2) nucleic acids may be present in the submitted sample  additional confirmatory testing may be necessary for epidemiological  and / or clinical management purposes  to differentiate between  SARS-CoV-2 and other Sarbecovirus currently known to infect humans.  If clinically indicated additional testing with an alternate test  methodology [id number]) is advised. The SARS-CoV-2 RNA is generally  detectable in upper and lower respiratory sp ecimens during the acute  phase of infection. The expected result is Negative. Fact Sheet for Patients:  StrictlyIdeas.no Fact Sheet for Healthcare Providers: BankingDealers.co.za This test is not yet approved or cleared by the [place] FDA and has been authorized for detection and/or diagnosis of SARS-CoV-2 by FDA under an Emergency Use Authorization (EUA).  This EUA will remain in effect (meaning this test can be used) for the duration of the COVID-19 declaration under Section 564(b)(1) of the Act, 21 U.S.C. section 360bbb-3(b)(1), unless the authorization is terminated or revoked  sooner. Performed at [facility], [address] [place], [place] [address]   MRSA PCR Screening     Status: None   Collection Time: 02/05/19  8:09 PM  Result Value Ref Range Status   MRSA by PCR NEGATIVE NEGATIVE Final    Comment:        The GeneXpert MRSA Assay (FDA approved for NASAL specimens only), is one component of a comprehensive MRSA colonization surveillance program. It is not intended to diagnose MRSA infection nor to guide or monitor treatment for MRSA infections. Performed at [facility], [address] [place],  [address]   Culture, blood (Routine X 2) w Reflex to ID Panel     Status: None (Preliminary result)   Collection Time: 02/06/19 11:02 AM  Result Value Ref Range Status   Specimen Description BLOOD BLOOD LEFT ARM  Final   Special Requests  Final    BOTTLES DRAWN AEROBIC AND ANAEROBIC Blood Culture adequate volume   Culture   Final    NO GROWTH < 24 HOURS Performed at [facility], [address] [place], [place] [address]    Report Status PENDING  Incomplete  Culture, blood (Routine X 2) w Reflex to ID Panel     Status: None (Preliminary result)   Collection Time: 02/06/19 11:17 AM  Result Value Ref Range Status   Specimen Description BLOOD BLOOD LEFT HAND  Final   Special Requests   Final    BOTTLES DRAWN AEROBIC AND ANAEROBIC Blood Culture results may not be optimal due to an inadequate volume of blood received in culture bottles   Culture   Final    NO GROWTH < 24 HOURS Performed at [facility], [address] [place], [place] [address]    Report Status PENDING  Incomplete    Coagulation Studies: No results for input(s): LABPROT, INR in the last 72 hours.  Urinalysis: Recent Labs    02/06/19 0605  COLORURINE YELLOW  LABSPEC 1.021  PHURINE 5.0  GLUCOSEU 50*  HGBUR NEGATIVE  BILIRUBINUR SMALL*  KETONESUR NEGATIVE  PROTEINUR 30*  NITRITE NEGATIVE  LEUKOCYTESUR NEGATIVE      Imaging: US Renal  Result Date:  02/06/2019 CLINICAL DATA:  Acute renal failure. History of diabetes and hypertension. EXAM: RENAL / URINARY TRACT ULTRASOUND COMPLETE COMPARISON:  None. FINDINGS: Right Kidney: Renal measurements: 11.7 x 5.4 x 6.2 cm = volume: 207 mL . Echogenicity within normal limits. No mass or hydronephrosis visualized. Left Kidney: Renal measurements: 11.6 x 6.7 x 6.6 cm = volume: 267 mL. Echogenicity within normal limits. No mass or hydronephrosis visualized. Bladder: Appears normal for degree of bladder distention. IMPRESSION: Normal renal ultrasound.  No hydronephrosis. Electronically Signed   By: Richardean Sale M.D.   On: 02/06/2019 13:32   Dg Chest Portable 1 View  Result Date: 02/05/2019 CLINICAL DATA:  Vomiting and loss of sense of taste. Recent cat bite EXAM: PORTABLE CHEST 1 VIEW COMPARISON:  July 28, 2018 FINDINGS: There is no appreciable edema or consolidation. Heart size and pulmonary vascularity are normal. No adenopathy. Evidence of old rib trauma on the left is stable. IMPRESSION: No edema or consolidation. Electronically Signed   By: Lowella Grip III M.D.   On: 02/05/2019 17:31     Medications:   .  sodium bicarbonate (isotonic) infusion in sterile water 125 mL/hr at 02/07/19 0600   . aspirin EC  81 mg Oral Q breakfast  . clopidogrel  75 mg Oral Daily  . famotidine  10 mg Oral Daily  . heparin injection (subcutaneous)  5,000 Units Subcutaneous Q8H  . insulin aspart  3 Units Subcutaneous TID WC  . insulin aspart  0-15 Units Subcutaneous TID WC  . insulin aspart  0-5 Units Subcutaneous QHS  . insulin aspart  3 Units Subcutaneous TID WC  . insulin detemir  20 Units Subcutaneous BID   acetaminophen, albuterol, alum & mag hydroxide-simeth, ondansetron (ZOFRAN) IV  Assessment/ Plan:   Acute kidney injury.  Labs drawn in November 2019 showed normal renal function.  Serendipitous event following animal bite hypotension on ACE inhibitor lisinopril and metformin.  Serum plateletpheresis  urine plateletpheresis pending.  CPK 236  Hypertension/volume 2D echo pending blood cultures negative, cortisol level looks reasonable.  Diabetes mellitus as per primary team  Coronary disease status post stent patient taking Plavix and aspirin  Cardiovascular disease statin and aspirin therapy.  Looks like the  renal function is continuing to improve I think this is hypotension in setting of an animal bite and ACE inhibitor use.  Looks like renal function will return to baseline he may follow-up at [facility] [phone] please call for appointment next 2 to 4 weeks Dr. Edrick Oh thank you   LOS: [place] @TODAY @12 :12 PM

## 2019-02-07 NOTE — Progress Notes (Addendum)
Inpatient Diabetes Program Recommendations  AACE/ADA: New Consensus Statement on Inpatient Glycemic Control (2015)  Target Ranges:  Prepandial:   less than 140 mg/dL      Peak postprandial:   less than 180 mg/dL (1-2 hours)      Critically ill patients:  140 - 180 mg/dL   Lab Results  Component Value Date   GLUCAP 196 (H) 02/07/2019   HGBA1C 14.9 (H) 02/05/2019    Review of Glycemic Control Diabetes history: DM 2 Outpatient Diabetes medications: Levemir 35 units tid, Novolog 25 units tid with meals if glucose >110, Metformin 1000 mg bid Current orders for Inpatient glycemic control: Levemir increased to 20 units bid, Novolog 0-15 units tid, and Novolog 0-5 units qhs   Inpatient Diabetes Program Recommendations:    Fasting glucose 196 this am to get second dose of Levemir 20 units this am. Watch trends for now.  Addendum 11 am: Spoke with patient over the phone regarding A1c level 14.8% this admission. Patient reports being diagnosed with DM 7 months ago when he was placed on insulin. Patient reports he takes Levemir 25-35 units BID, and Novolog 0-10 units bid with meals will take a third time if glucose is high (per patient report).  Patient did not know what an A1c was. Discussed his current A1c level of 14.8%. Discussed glucose and A1c goals. Patient reports just being at his PCP office 1 month ago and had changes made to his medications at that time.   Patient reports checking his glucose 3 times a day and said he is seeing glucose levels in the 200-300 range. Discussed patient needs to call his PCP every week if glucose trends are greater than 200 all the time.  Patient reports needing a refill on glucose strips at time of d/c. Order #  [id number]  Thanks,  Tama Headings RN, MSN, BC-ADM Inpatient Diabetes Coordinator Team Pager [phone] (8a-5p)

## 2019-02-07 NOTE — Progress Notes (Signed)
*  PRELIMINARY RESULTS* Echocardiogram 2D Echocardiogram has been performed.  Leavy Cella 02/07/2019, 9:17 AM

## 2019-02-08 LAB — CBC
HCT: 35.3 % — ABNORMAL LOW (ref 39.0–52.0)
Hemoglobin: 11.5 g/dL — ABNORMAL LOW (ref 13.0–17.0)
MCH: 28.4 pg (ref 26.0–34.0)
MCHC: 32.6 g/dL (ref 30.0–36.0)
MCV: 87.2 fL (ref 80.0–100.0)
Platelets: 280 10*3/uL (ref 150–400)
RBC: 4.05 MIL/uL — ABNORMAL LOW (ref 4.22–5.81)
RDW: 15.2 % (ref 11.5–15.5)
WBC: 7.2 10*3/uL (ref 4.0–10.5)
nRBC: 0 % (ref 0.0–0.2)

## 2019-02-08 LAB — GLUCOSE, CAPILLARY: Glucose-Capillary: 145 mg/dL — ABNORMAL HIGH (ref 70–99)

## 2019-02-08 LAB — BASIC METABOLIC PANEL
Anion gap: 14 (ref 5–15)
BUN: 24 mg/dL — ABNORMAL HIGH (ref 6–20)
CO2: 28 mmol/L (ref 22–32)
Calcium: 7.7 mg/dL — ABNORMAL LOW (ref 8.9–10.3)
Chloride: 97 mmol/L — ABNORMAL LOW (ref 98–111)
Creatinine, Ser: 1.38 mg/dL — ABNORMAL HIGH (ref 0.61–1.24)
GFR calc Af Amer: >60 mL/min (ref >60)
GFR calc non Af Amer: 57 mL/min — ABNORMAL LOW (ref >60)
Glucose, Bld: 149 mg/dL — ABNORMAL HIGH (ref 70–99)
Potassium: 3.4 mmol/L — ABNORMAL LOW (ref 3.5–5.1)
Sodium: 139 mmol/L (ref 135–145)

## 2019-02-08 LAB — MAGNESIUM: Magnesium: 1.8 mg/dL (ref 1.7–2.4)

## 2019-02-08 MED ORDER — GLUCOSE BLOOD VI STRP: ORAL_STRIP | 12 refills | Status: AC

## 2019-02-08 MED ORDER — POTASSIUM CHLORIDE CRYS ER 20 MEQ PO TBCR
40.0000 meq | EXTENDED_RELEASE_TABLET | Freq: Once | ORAL | Status: AC
  Administered 2019-02-08: 40 meq via ORAL
  Filled 2019-02-08: qty 2

## 2019-02-08 MED ORDER — RABIES VACCINE, PCEC IM SUSR
1.0000 mL | Freq: Once | INTRAMUSCULAR | Status: AC
  Administered 2019-02-08: 1 mL via INTRAMUSCULAR
  Filled 2019-02-08: qty 1

## 2019-02-08 NOTE — Progress Notes (Signed)
Subjective:  Excellent UOP- crt down to 1.38- seeming for discharge today  Objective Vital signs in last 24 hours: Vitals:   02/07/19 1325 02/07/19 2159 02/08/19 0200 02/08/19 0611  BP: (!) 107/59 128/83 121/63 121/78  Pulse: 75 71 75 70  Resp: 18 18 18 19   Temp: 98.8 F (37.1 C) 98.9 F (37.2 C) 99.1 F (37.3 C) 97.7 F (36.5 C)  TempSrc: Oral Oral Oral Oral  SpO2: 100% 98% 97% 96%  Weight:      Height:       Weight change: 0.9 kg  Intake/Output Summary (Last 24 hours) at 02/08/2019 8250 Last data filed at 02/08/2019 0900 Gross per 24 hour  Intake 720 ml  Output 1850 ml  Net -1130 ml    Assessment/ Plan: Pt is a 58 y.o. yo male who was admitted on 02/05/2019 with AKI in the setting of animal bite, hyperglycemia and hypotension on an ACE-I  Assessment/Plan: 1. AKI- quick resolution with only holding ACE-I... ulltrasound and U/A negative, CK not pathologic.  Only thing pending is SPEP and UPEP but feel is of low yield.  Given that renal function is completely resolved actually do not feel like he needs renal specific follow up - I would just recommend he follow up with his PCP 2.HTN/vol-  BP is better, does not need IVF any longer.  Would not resume ACE-I- let him follow up with his PCP for management  3. Anemia- not an issue 4. Hypokalemia- repletion ordered today.  Should be Ok on outside when goes back to regular diet 5. Dispo-  Anticipate discharge today    Louis Meckel    Labs: Basic Metabolic Panel: Recent Labs  Lab 02/06/19 1113 02/07/19 0455 02/08/19 0552  NA 133* 136 139  K 3.8 3.2* 3.4*  CL 100 98 97*  CO2 17* 25 28  GLUCOSE 262* 229* 149*  BUN 76* 55* 24*  CREATININE 9.14* 3.51* 1.38*  CALCIUM 7.1* 7.1* 7.7*   Liver Function Tests: Recent Labs  Lab 02/05/19 1542  AST 15  ALT 14  ALKPHOS 100  BILITOT 0.5  PROT 8.0  ALBUMIN 3.6   No results for input(s): LIPASE, AMYLASE in the last 168 hours. No results for input(s): AMMONIA in the  last 168 hours. CBC: Recent Labs  Lab 02/05/19 1542 02/07/19 0455 02/08/19 0552  WBC 7.2 6.2 7.2  NEUTROABS 4.6  --   --   HGB 13.7 11.2* 11.5*  HCT 42.6 34.7* 35.3*  MCV 86.8 86.3 87.2  PLT 290 254 280   Cardiac Enzymes: Recent Labs  Lab 02/06/19 0255  CKTOTAL 236  CKMB 2.4  TROPONINI <0.03   CBG: Recent Labs  Lab 02/07/19 0739 02/07/19 1121 02/07/19 1620 02/07/19 2159 02/08/19 0729  GLUCAP 196* 345* 214* 288* 145*    Iron Studies: No results for input(s): IRON, TIBC, TRANSFERRIN, FERRITIN in the last 72 hours. Studies/Results: US Renal  Result Date: 02/06/2019 CLINICAL DATA:  Acute renal failure. History of diabetes and hypertension. EXAM: RENAL / URINARY TRACT ULTRASOUND COMPLETE COMPARISON:  None. FINDINGS: Right Kidney: Renal measurements: 11.7 x 5.4 x 6.2 cm = volume: 207 mL . Echogenicity within normal limits. No mass or hydronephrosis visualized. Left Kidney: Renal measurements: 11.6 x 6.7 x 6.6 cm = volume: 267 mL. Echogenicity within normal limits. No mass or hydronephrosis visualized. Bladder: Appears normal for degree of bladder distention. IMPRESSION: Normal renal ultrasound.  No hydronephrosis. Electronically Signed   By: Richardean Sale M.D.   On:  02/06/2019 13:32   Medications: Infusions: .  sodium bicarbonate (isotonic) infusion in sterile water 125 mL/hr at 02/08/19 0414    Scheduled Medications: . aspirin EC  81 mg Oral Q breakfast  . atorvastatin  80 mg Oral QPM  . clopidogrel  75 mg Oral Daily  . famotidine  10 mg Oral Daily  . heparin injection (subcutaneous)  5,000 Units Subcutaneous Q8H  . insulin aspart  0-15 Units Subcutaneous TID WC  . insulin aspart  0-5 Units Subcutaneous QHS  . insulin aspart  3 Units Subcutaneous TID WC  . insulin detemir  20 Units Subcutaneous BID    have reviewed scheduled and prn medications.  Physical Exam: General: NAD- dressed and ready to go  Heart: RRR Lungs: clear Abdomen: soft, non  tender Extremities: min edema    02/08/2019,9:25 AM  LOS: 3 days

## 2019-02-08 NOTE — Discharge Summary (Signed)
Physician Discharge Summary  Gregory Smith SPQ:[id number] DOB: Sep 27, 1960 DOA: 02/05/2019  PCP: Jani Gravel, MD  Admit date: 02/05/2019  Discharge date: 02/08/2019  Admitted From:Home  Disposition:  Home  Recommendations for Outpatient Follow-up:  1. Follow up with PCP in 3 to 5 days to assess blood glucose response and need for further rabies vaccination.  Repeat BMP in 1 week. 2. Patient was given rabies prophylaxis and vaccine on day 0 as well as day 3 which was 5/25.  His cat is low risk and does not exhibit rabies-like behavior and has been cleared by his Animal nutritionist.  Samule Dry will and on 5/27 and if his pet has been cleared, he will not require any further vaccination on day 7 and day 14.  Otherwise please consider vaccination at that time. 3. Continue home medications aside from lisinopril and monitor blood pressure in office with repeat BMP in 1 week.  Home Health: None  Equipment/Devices: None  Discharge Condition: Stable  CODE STATUS: Full  Diet recommendation: Heart Healthy/carb modified  Brief/Interim Summary: Per HPI: Gregory Smith a 58 y.o.malewith a history of type 2 diabetes, hypertension, history of MI, GERD, hyperlipidemia. Patient seen for cat bite on his left index finger which happened earlier this week. No redness or swelling, but presented due to not feeling well with vomiting and nausea. The patient states that the cat did not look well and is at the bedside being tested. Patient has had vomiting over the past 3 days which is mainly stomach contents. Eating and drinking exacerbate his vomiting. No other palliating or provoking factors. Patient has been taking his medications, including his metformin and his lisinopril. He has been urinating, although less frequently.  He is noted to be significantly oliguric with acute renal failure and this has resolved with treatment of DKA and aggressive IV fluid hydration.  Nephrology had seen patient with  recommendations to remain off ACE inhibitor at this point and follow-up further BMP and blood pressure levels in the near future with PCP and resume as indicated.  He is noted to have hemoglobin A1c over 14% and requires compliance with his diet.  He will resume other home medications at this time and has received refills of his glucose testing strips.  Additionally, there was some concern of possible rabies exposure for which patient has received immunoglobulins as well as vaccination on day 0 and day 3 which would be the day of discharge.  His cat will leave quarantine this week, and thus far does not exhibit any symptoms or odd behavior.  It is unlikely that he will require further vaccination on day 7 and day 14, but this should be considered should there be any change to the cats condition.  Patient is otherwise stable for discharge at this time.  Discharge Diagnoses:  Principal Problem:   ARF (acute renal failure) (HCC) Active Problems:   Essential hypertension   Uncontrolled type 2 diabetes mellitus ([place])   Mixed hyperlipidemia   Uncontrolled type II diabetes mellitus ([place])   DKA (diabetic ketoacidoses) (HCC)   Cat bite  Principal discharge diagnosis: DKA in the setting of type 2 diabetes secondary to dietary noncompliance with associated AKI.  Discharge Instructions  Discharge Instructions    Diet - low sodium heart healthy   Complete by:  As directed    Increase activity slowly   Complete by:  As directed      Allergies as of 02/08/2019   No Known Allergies     Medication List  STOP taking these medications   lisinopril 10 MG tablet Commonly known as:  ZESTRIL     TAKE these medications   albuterol 108 (90 Base) MCG/ACT inhaler Commonly known as:  VENTOLIN HFA Inhale 1-2 puffs into the lungs every 6 (six) hours as needed for wheezing or shortness of breath.   aspirin EC 81 MG tablet Take 1 tablet (81 mg total) by mouth daily with breakfast. For heart    atorvastatin 80 MG tablet Commonly known as:  LIPITOR Take 1 tablet (80 mg total) by mouth every evening. For cholesterol   carvedilol 3.125 MG tablet Commonly known as:  COREG Take 1 tablet (3.125 mg total) by mouth 2 (two) times daily with a meal. For heart-blood pressure   clopidogrel 75 MG tablet Commonly known as:  PLAVIX Take 1 tablet (75 mg total) by mouth daily. For heart-blood thinner   famotidine 20 MG tablet Commonly known as:  PEPCID Take 1 tablet (20 mg total) by mouth 2 (two) times daily.   glucose blood test strip Use as instructed   insulin aspart 100 UNIT/ML FlexPen Commonly known as:  NOVOLOG Inject 3 Units into the skin 3 (three) times daily with meals. As long as glucose before meal is > 110 What changed:  how much to take   insulin detemir 100 unit/ml Soln Commonly known as:  LEVEMIR Inject 0.18 mLs (18 Units total) into the skin 2 (two) times daily. For Sugar diabetes What changed:    how much to take  when to take this   metFORMIN 1000 MG tablet Commonly known as:  Glucophage Take 1 tablet (1,000 mg total) by mouth 2 (two) times daily with a meal.   omeprazole 20 MG capsule Commonly known as:  PRILOSEC Take 1 capsule (20 mg total) by mouth daily. For stomach      Follow-up Information    Jani Gravel, MD Follow up in 3 day(s).   Specialty:  Internal Medicine Contact information: [place] [address] [phone]          No Known Allergies  Consultations:  Nephrology   Procedures/Studies: US Renal  Result Date: 02/06/2019 CLINICAL DATA:  Acute renal failure. History of diabetes and hypertension. EXAM: RENAL / URINARY TRACT ULTRASOUND COMPLETE COMPARISON:  None. FINDINGS: Right Kidney: Renal measurements: 11.7 x 5.4 x 6.2 cm = volume: 207 mL . Echogenicity within normal limits. No mass or hydronephrosis visualized. Left Kidney: Renal measurements: 11.6 x 6.7 x 6.6 cm = volume: 267 mL. Echogenicity  within normal limits. No mass or hydronephrosis visualized. Bladder: Appears normal for degree of bladder distention. IMPRESSION: Normal renal ultrasound.  No hydronephrosis. Electronically Signed   By: Richardean Sale M.D.   On: 02/06/2019 13:32   Dg Chest Portable 1 View  Result Date: 02/05/2019 CLINICAL DATA:  Vomiting and loss of sense of taste. Recent cat bite EXAM: PORTABLE CHEST 1 VIEW COMPARISON:  July 28, 2018 FINDINGS: There is no appreciable edema or consolidation. Heart size and pulmonary vascularity are normal. No adenopathy. Evidence of old rib trauma on the left is stable. IMPRESSION: No edema or consolidation. Electronically Signed   By: Lowella Grip III M.D.   On: 02/05/2019 17:31    Discharge Exam: Vitals:   02/08/19 0200 02/08/19 0611  BP: 121/63 121/78  Pulse: 75 70  Resp: 18 19  Temp: 99.1 F (37.3 C) 97.7 F (36.5 C)  SpO2: 97% 96%   Vitals:   02/07/19 1325 02/07/19 2159  02/08/19 0200 02/08/19 0611  BP: (!) 107/59 128/83 121/63 121/78  Pulse: 75 71 75 70  Resp: 18 18 18 19   Temp: 98.8 F (37.1 C) 98.9 F (37.2 C) 99.1 F (37.3 C) 97.7 F (36.5 C)  TempSrc: Oral Oral Oral Oral  SpO2: 100% 98% 97% 96%  Weight:      Height:        General: Pt is alert, awake, not in acute distress Cardiovascular: RRR, S1/S2 +, no rubs, no gallops Respiratory: CTA bilaterally, no wheezing, no rhonchi Abdominal: Soft, NT, ND, bowel sounds + Extremities: no edema, no cyanosis    The results of significant diagnostics from this hospitalization (including imaging, microbiology, ancillary and laboratory) are listed below for reference.     Microbiology: Recent Results (from the past 240 hour(s))  SARS Coronavirus 2 (CEPHEID- Performed in [facility] hospital lab), Hosp Order     Status: None   Collection Time: 02/05/19  5:26 PM  Result Value Ref Range Status   SARS Coronavirus 2 NEGATIVE NEGATIVE Final    Comment: (NOTE) If result is NEGATIVE SARS-CoV-2  target nucleic acids are NOT DETECTED. The SARS-CoV-2 RNA is generally detectable in upper and lower  respiratory specimens during the acute phase of infection. The lowest  concentration of SARS-CoV-2 viral copies this assay can detect is 250  copies / mL. A negative result does not preclude SARS-CoV-2 infection  and should not be used as the sole basis for treatment or other  patient management decisions.  A negative result may occur with  improper specimen collection / handling, submission of specimen other  than nasopharyngeal swab, presence of viral mutation(s) within the  areas targeted by this assay, and inadequate number of viral copies  (<250 copies / mL). A negative result must be combined with clinical  observations, patient history, and epidemiological information. If result is POSITIVE SARS-CoV-2 target nucleic acids are DETECTED. The SARS-CoV-2 RNA is generally detectable in upper and lower  respiratory specimens dur ing the acute phase of infection.  Positive  results are indicative of active infection with SARS-CoV-2.  Clinical  correlation with patient history and other diagnostic information is  necessary to determine patient infection status.  Positive results do  not rule out bacterial infection or co-infection with other viruses. If result is PRESUMPTIVE POSTIVE SARS-CoV-2 nucleic acids MAY BE PRESENT.   A presumptive positive result was obtained on the submitted specimen  and confirmed on repeat testing.  While 2019 novel coronavirus  (SARS-CoV-2) nucleic acids may be present in the submitted sample  additional confirmatory testing may be necessary for epidemiological  and / or clinical management purposes  to differentiate between  SARS-CoV-2 and other Sarbecovirus currently known to infect humans.  If clinically indicated additional testing with an alternate test  methodology [phone]) is advised. The SARS-CoV-2 RNA is generally  detectable in upper and lower  respiratory sp ecimens during the acute  phase of infection. The expected result is Negative. Fact Sheet for Patients:  StrictlyIdeas.no Fact Sheet for Healthcare Providers: BankingDealers.co.za This test is not yet approved or cleared by the [place] FDA and has been authorized for detection and/or diagnosis of SARS-CoV-2 by FDA under an Emergency Use Authorization (EUA).  This EUA will remain in effect (meaning this test can be used) for the duration of the COVID-19 declaration under Section 564(b)(1) of the Act, 21 U.S.C. section 360bbb-3(b)(1), unless the authorization is terminated or revoked sooner. Performed at [facility], [address] [place],  [place] [address]   MRSA PCR Screening     Status: None   Collection Time: 02/05/19  8:09 PM  Result Value Ref Range Status   MRSA by PCR NEGATIVE NEGATIVE Final    Comment:        The GeneXpert MRSA Assay (FDA approved for NASAL specimens only), is one component of a comprehensive MRSA colonization surveillance program. It is not intended to diagnose MRSA infection nor to guide or monitor treatment for MRSA infections. Performed at [facility], [address] [place], [place] [address]   Culture, blood (Routine X 2) w Reflex to ID Panel     Status: None (Preliminary result)   Collection Time: 02/06/19 11:02 AM  Result Value Ref Range Status   Specimen Description BLOOD BLOOD LEFT ARM  Final   Special Requests   Final    BOTTLES DRAWN AEROBIC AND ANAEROBIC Blood Culture adequate volume   Culture   Final    NO GROWTH 2 DAYS Performed at [facility], [address] [place], [place] [address]    Report Status PENDING  Incomplete  Culture, blood (Routine X 2) w Reflex to ID Panel     Status: None (Preliminary result)   Collection Time: 02/06/19 11:17 AM  Result Value Ref Range Status   Specimen Description BLOOD BLOOD LEFT HAND  Final   Special Requests   Final     BOTTLES DRAWN AEROBIC AND ANAEROBIC Blood Culture results may not be optimal due to an inadequate volume of blood received in culture bottles   Culture   Final    NO GROWTH 2 DAYS Performed at [facility], [address], [place], [place] [address]    Report Status PENDING  Incomplete     Labs: BNP (last 3 results) Recent Labs    07/18/18 1715  BNP 9.0   Basic Metabolic Panel: Recent Labs  Lab 02/05/19 1726  02/06/19 0255 02/06/19 0727 02/06/19 1113 02/07/19 0455 02/08/19 0552  NA  --    < > 135 133* 133* 136 139  K  --    < > 3.7 4.0 3.8 3.2* 3.4*  CL  --    < > 97* 99 100 98 97*  CO2  --    < > 18* 15* 17* 25 28  GLUCOSE  --    < > 118* 204* 262* 229* 149*  BUN  --    < > 79* 81* 76* 55* 24*  CREATININE  --    < > 11.86* 10.45* 9.14* 3.51* 1.38*  CALCIUM  --    < > 7.3* 7.2* 7.1* 7.1* 7.7*  MG 2.6*  --   --   --   --  2.2 1.8   < > = values in this interval not displayed.   Liver Function Tests: Recent Labs  Lab 02/05/19 1542  AST 15  ALT 14  ALKPHOS 100  BILITOT 0.5  PROT 8.0  ALBUMIN 3.6   No results for input(s): LIPASE, AMYLASE in the last 168 hours. No results for input(s): AMMONIA in the last 168 hours. CBC: Recent Labs  Lab 02/05/19 1542 02/07/19 0455 02/08/19 0552  WBC 7.2 6.2 7.2  NEUTROABS 4.6  --   --   HGB 13.7 11.2* 11.5*  HCT 42.6 34.7* 35.3*  MCV 86.8 86.3 87.2  PLT 290 254 280   Cardiac Enzymes: Recent Labs  Lab 02/06/19 0255  CKTOTAL 236  CKMB 2.4  TROPONINI <0.03   BNP: Invalid input(s): POCBNP CBG:  Recent Labs  Lab 02/07/19 0739 02/07/19 1121 02/07/19 1620 02/07/19 2159 02/08/19 0729  GLUCAP 196* 345* 214* 288* 145*   D-Dimer No results for input(s): DDIMER in the last 72 hours. Hgb A1c Recent Labs    02/05/19 1541  HGBA1C 14.9*   Lipid Profile No results for input(s): CHOL, HDL, LDLCALC, TRIG, CHOLHDL, LDLDIRECT in the last 72 hours. Thyroid function studies No results for input(s): TSH, T4TOTAL, T3FREE,  THYROIDAB in the last 72 hours.  Invalid input(s): FREET3 Anemia work up No results for input(s): VITAMINB12, FOLATE, FERRITIN, TIBC, IRON, RETICCTPCT in the last 72 hours. Urinalysis    Component Value Date/Time   COLORURINE YELLOW 02/06/2019 0605   APPEARANCEUR HAZY (A) 02/06/2019 0605   LABSPEC 1.021 02/06/2019 0605   PHURINE 5.0 02/06/2019 0605   GLUCOSEU 50 (A) 02/06/2019 0605   HGBUR NEGATIVE 02/06/2019 0605   BILIRUBINUR SMALL (A) 02/06/2019 0605   KETONESUR NEGATIVE 02/06/2019 0605   PROTEINUR 30 (A) 02/06/2019 0605   NITRITE NEGATIVE 02/06/2019 0605   LEUKOCYTESUR NEGATIVE 02/06/2019 0605   Sepsis Labs Invalid input(s): PROCALCITONIN,  WBC,  LACTICIDVEN Microbiology Recent Results (from the past 240 hour(s))  SARS Coronavirus 2 (CEPHEID- Performed in [place] hospital lab), Hosp Order     Status: None   Collection Time: 02/05/19  5:26 PM  Result Value Ref Range Status   SARS Coronavirus 2 NEGATIVE NEGATIVE Final    Comment: (NOTE) If result is NEGATIVE SARS-CoV-2 target nucleic acids are NOT DETECTED. The SARS-CoV-2 RNA is generally detectable in upper and lower  respiratory specimens during the acute phase of infection. The lowest  concentration of SARS-CoV-2 viral copies this assay can detect is 250  copies / mL. A negative result does not preclude SARS-CoV-2 infection  and should not be used as the sole basis for treatment or other  patient management decisions.  A negative result may occur with  improper specimen collection / handling, submission of specimen other  than nasopharyngeal swab, presence of viral mutation(s) within the  areas targeted by this assay, and inadequate number of viral copies  (<250 copies / mL). A negative result must be combined with clinical  observations, patient history, and epidemiological information. If result is POSITIVE SARS-CoV-2 target nucleic acids are DETECTED. The SARS-CoV-2 RNA is generally detectable in upper and  lower  respiratory specimens dur ing the acute phase of infection.  Positive  results are indicative of active infection with SARS-CoV-2.  Clinical  correlation with patient history and other diagnostic information is  necessary to determine patient infection status.  Positive results do  not rule out bacterial infection or co-infection with other viruses. If result is PRESUMPTIVE POSTIVE SARS-CoV-2 nucleic acids MAY BE PRESENT.   A presumptive positive result was obtained on the submitted specimen  and confirmed on repeat testing.  While 2019 novel coronavirus  (SARS-CoV-2) nucleic acids may be present in the submitted sample  additional confirmatory testing may be necessary for epidemiological  and / or clinical management purposes  to differentiate between  SARS-CoV-2 and other Sarbecovirus currently known to infect humans.  If clinically indicated additional testing with an alternate test  methodology [phone]) is advised. The SARS-CoV-2 RNA is generally  detectable in upper and lower respiratory sp ecimens during the acute  phase of infection. The expected result is Negative. Fact Sheet for Patients:  StrictlyIdeas.no Fact Sheet for Healthcare Providers: BankingDealers.co.za This test is not yet approved or cleared by the [place] FDA and has been authorized  for detection and/or diagnosis of SARS-CoV-2 by FDA under an Emergency Use Authorization (EUA).  This EUA will remain in effect (meaning this test can be used) for the duration of the COVID-19 declaration under Section 564(b)(1) of the Act, 21 U.S.C. section 360bbb-3(b)(1), unless the authorization is terminated or revoked sooner. Performed at [facility], [address] [place], [place] [address]   MRSA PCR Screening     Status: None   Collection Time: 02/05/19  8:09 PM  Result Value Ref Range Status   MRSA by PCR NEGATIVE NEGATIVE Final    Comment:        The  GeneXpert MRSA Assay (FDA approved for NASAL specimens only), is one component of a comprehensive MRSA colonization surveillance program. It is not intended to diagnose MRSA infection nor to guide or monitor treatment for MRSA infections. Performed at [facility], [address] [place], [place] [address]   Culture, blood (Routine X 2) w Reflex to ID Panel     Status: None (Preliminary result)   Collection Time: 02/06/19 11:02 AM  Result Value Ref Range Status   Specimen Description BLOOD BLOOD LEFT ARM  Final   Special Requests   Final    BOTTLES DRAWN AEROBIC AND ANAEROBIC Blood Culture adequate volume   Culture   Final    NO GROWTH 2 DAYS Performed at [facility], [address], [place], [place] [address]    Report Status PENDING  Incomplete  Culture, blood (Routine X 2) w Reflex to ID Panel     Status: None (Preliminary result)   Collection Time: 02/06/19 11:17 AM  Result Value Ref Range Status   Specimen Description BLOOD BLOOD LEFT HAND  Final   Special Requests   Final    BOTTLES DRAWN AEROBIC AND ANAEROBIC Blood Culture results may not be optimal due to an inadequate volume of blood received in culture bottles   Culture   Final    NO GROWTH 2 DAYS Performed at [facility], [address], [place], [place] [address]    Report Status PENDING  Incomplete     Time coordinating discharge: 40 minutes  SIGNED:   Rodena Goldmann, DO [facility] 02/08/2019, 10:10 AM  If 7PM-7AM, please contact night-coverage [email or web address] Password [email or web address]

## 2019-02-10 LAB — PROTEIN ELECTROPHORESIS, SERUM
A/G Ratio: 0.8 (ref 0.7–1.7)
Albumin ELP: 2.6 g/dL — ABNORMAL LOW (ref 2.9–4.4)
Alpha-1-Globulin: 0.2 g/dL (ref 0.0–0.4)
Alpha-2-Globulin: 1.2 g/dL — ABNORMAL HIGH (ref 0.4–1.0)
Beta Globulin: 1 g/dL (ref 0.7–1.3)
Gamma Globulin: 0.8 g/dL (ref 0.4–1.8)
Globulin, Total: 3.2 g/dL (ref 2.2–3.9)
Total Protein ELP: 5.8 g/dL — ABNORMAL LOW (ref 6.0–8.5)

## 2019-02-10 LAB — IMMUNOFIXATION, URINE

## 2019-02-11 LAB — CULTURE, BLOOD (ROUTINE X 2)
Culture: NO GROWTH
Culture: NO GROWTH
Special Requests: ADEQUATE

## 2019-08-16 IMAGING — US US RENAL
1 series · 14 of 25 positions shown · non-contrast
Comparison: None.

CLINICAL DATA: Acute renal failure. History of diabetes and
hypertension.

EXAM:
RENAL / URINARY TRACT ULTRASOUND COMPLETE

[Series 1: us renal · 14 of 77 slices shown]
[im 1/77]
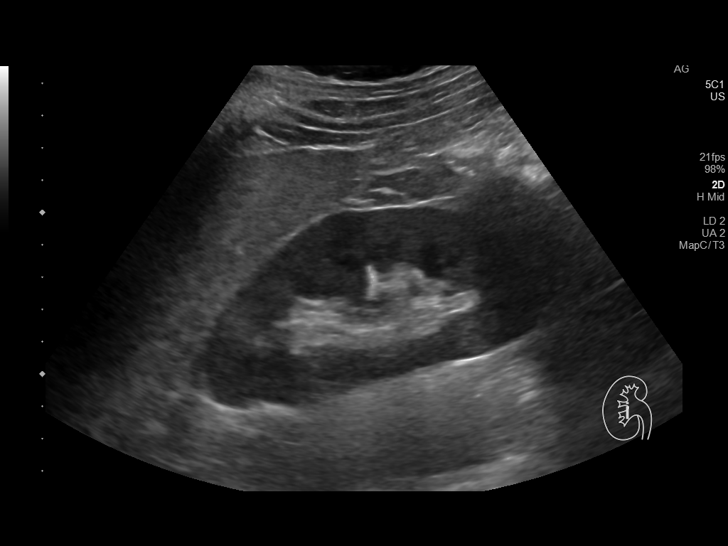
[im 7/77]
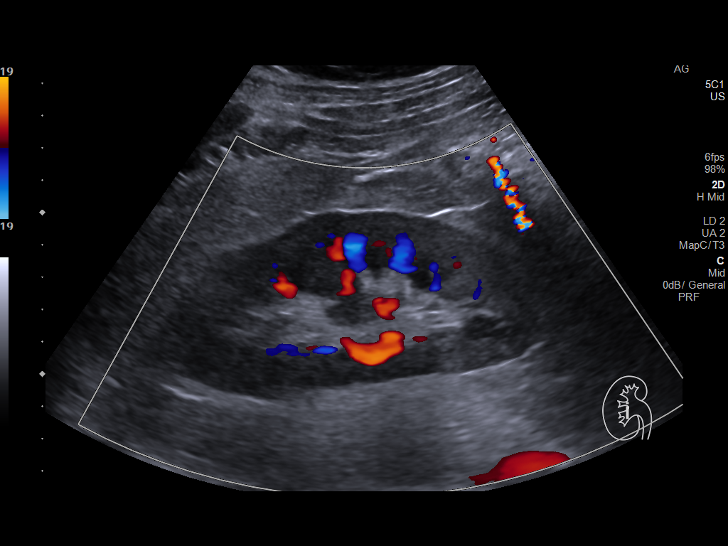
[im 13/77]
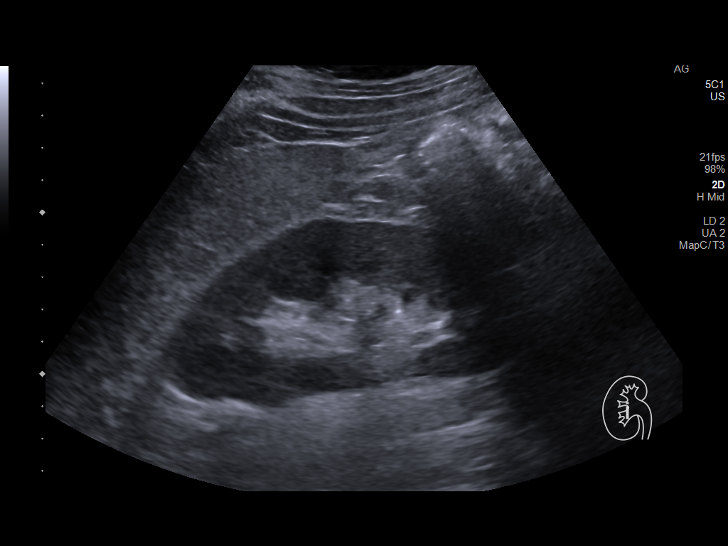
[im 20/77]
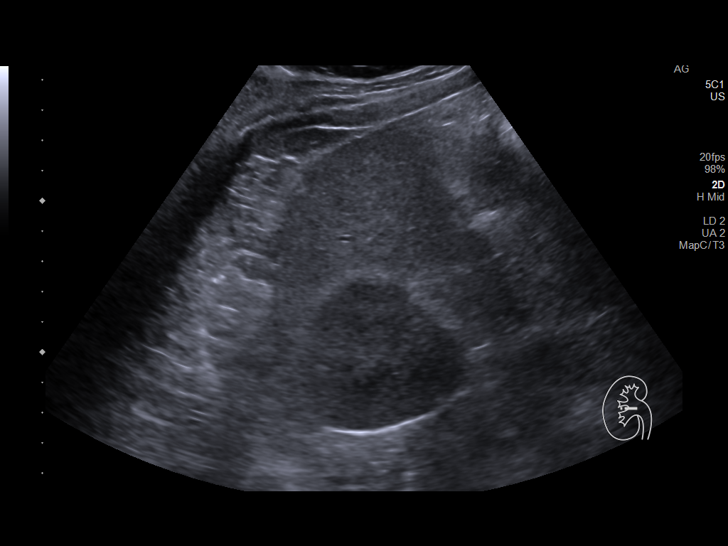
[im 26/77]
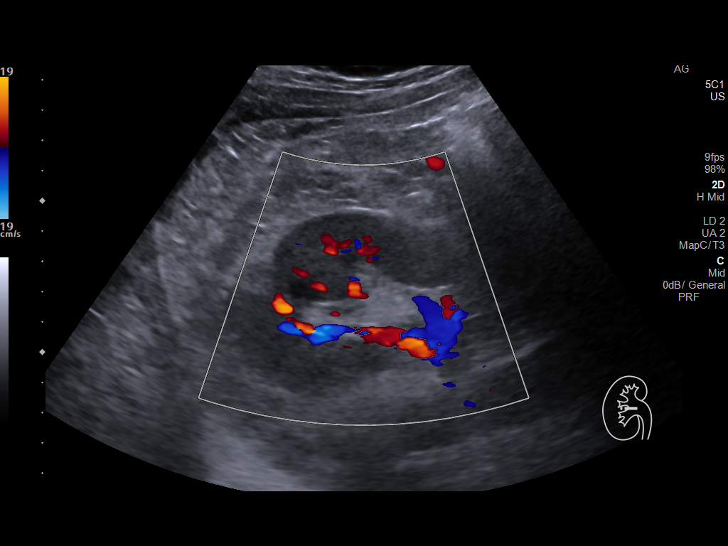
[im 29/77]
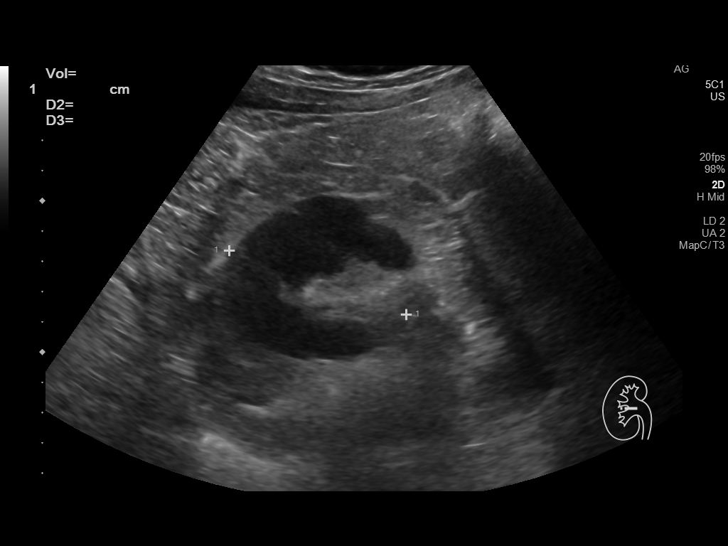
[im 35/77]
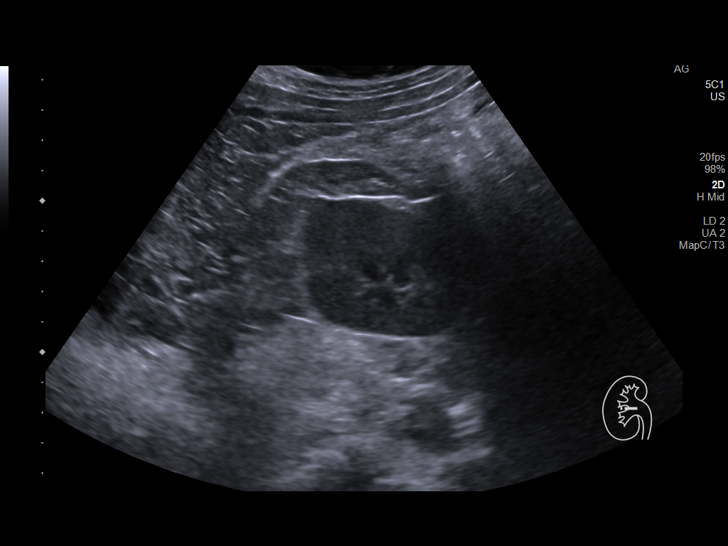
[im 42/77]
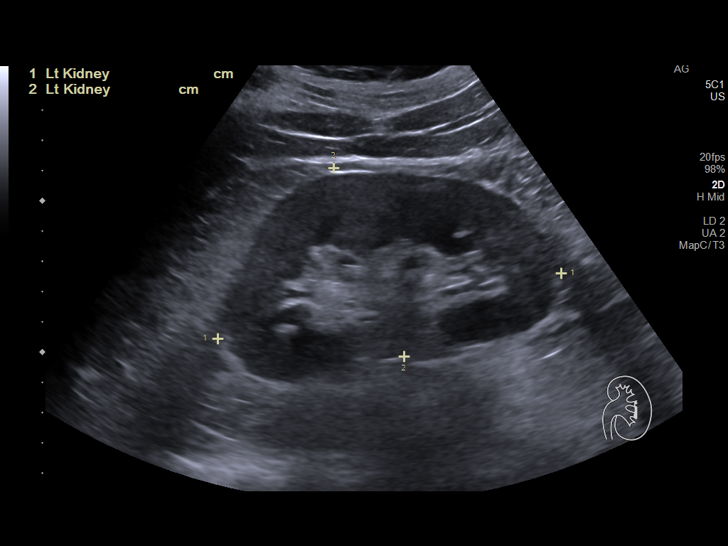
[im 48/77]
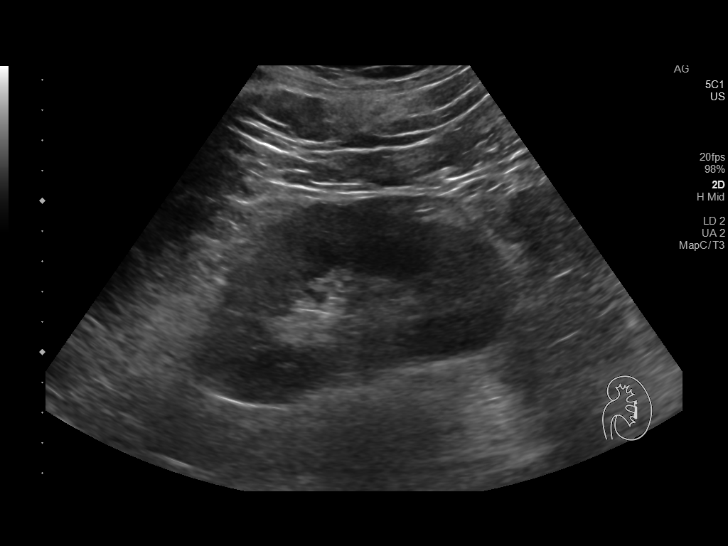
[im 51/77]
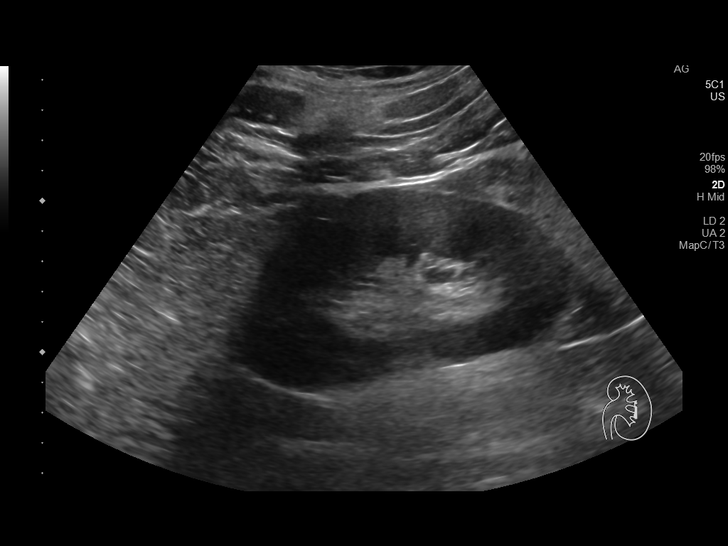
[im 58/77]
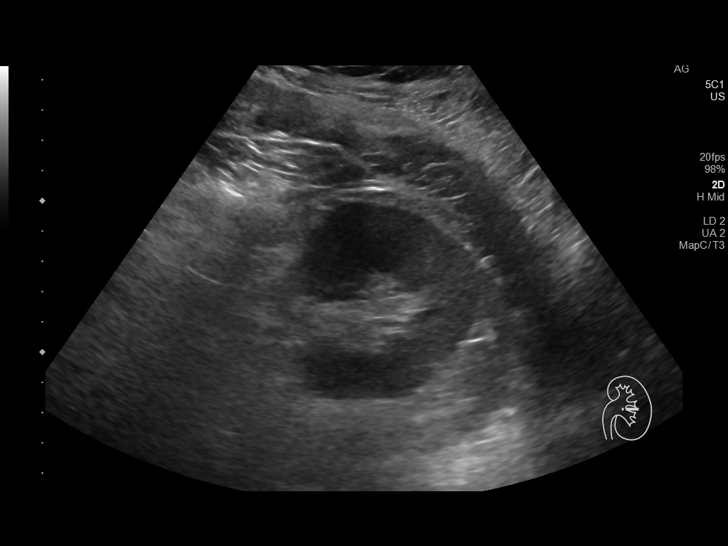
[im 64/77]
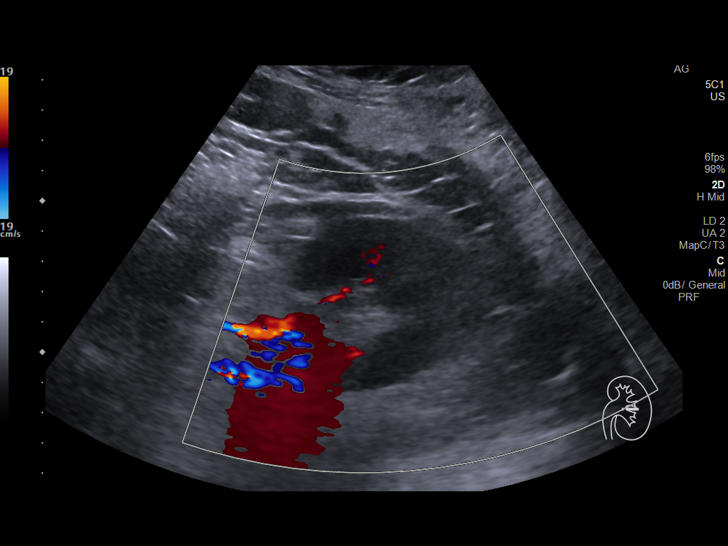
[im 70/77]
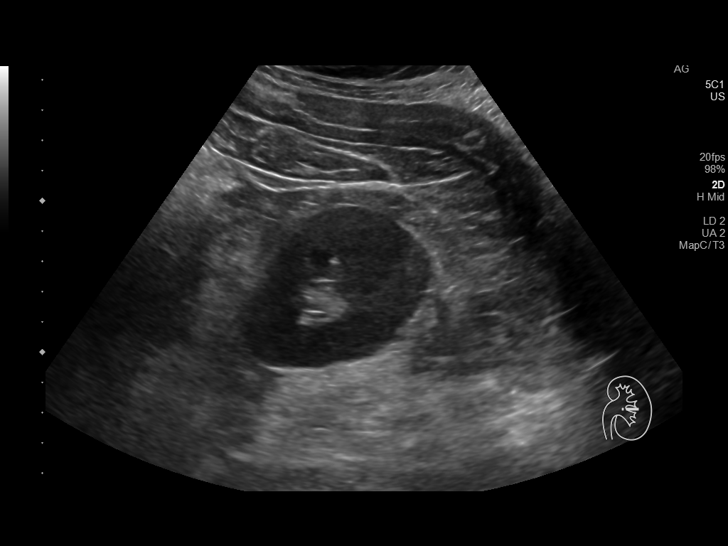
[im 77/77]
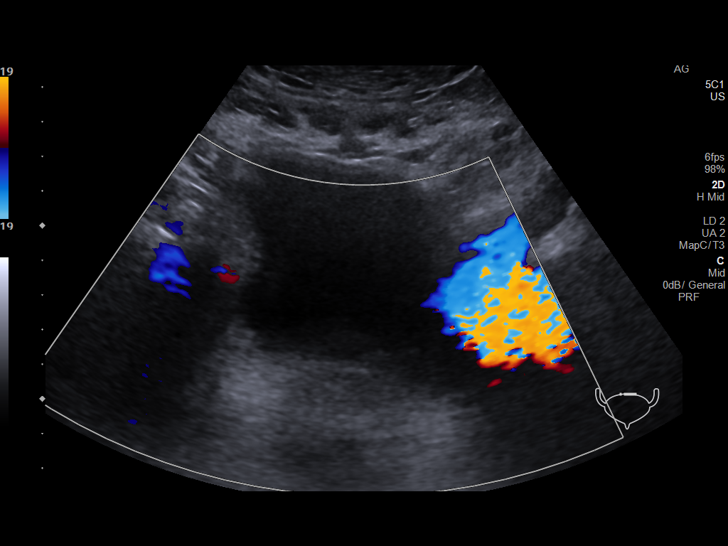

[14 of 25 positions shown; findings below may reference images not displayed]

FINDINGS: Right Kidney:

Renal measurements: 11.7 x 5.4 x 6.2 cm = volume: 207 mL .
Echogenicity within normal limits. No mass or hydronephrosis
visualized.

Left Kidney:

Renal measurements: 11.6 x 6.7 x 6.6 cm = volume: 267 mL.
Echogenicity within normal limits. No mass or hydronephrosis
visualized.

Bladder:

Appears normal for degree of bladder distention.
IMPRESSION: Normal renal ultrasound.  No hydronephrosis.

## 2023-01-28 ENCOUNTER — Encounter: Payer: Self-pay | Admitting: *Deleted
